# Patient Record
Sex: Female | Born: 1955 | Marital: Married | Smoking: Current every day smoker
Health system: Southern US, Community
[De-identification: ages and names within clinical notes are randomized; demographics above are authoritative.]

## PROBLEM LIST (undated history)

## (undated) DIAGNOSIS — C50919 Malignant neoplasm of unspecified site of unspecified female breast: Secondary | ICD-10-CM

## (undated) DIAGNOSIS — I10 Essential (primary) hypertension: Secondary | ICD-10-CM

## (undated) DIAGNOSIS — E7800 Pure hypercholesterolemia, unspecified: Secondary | ICD-10-CM

## (undated) HISTORY — DX: Essential (primary) hypertension: I10

## (undated) HISTORY — DX: Malignant neoplasm of unspecified site of unspecified female breast: C50.919

## (undated) HISTORY — DX: Pure hypercholesterolemia, unspecified: E78.00

---

## 1989-09-21 HISTORY — PX: CARPAL TUNNEL RELEASE: SHX101

## 1994-09-21 HISTORY — PX: GANGLION CYST EXCISION: SHX1691

## 2005-09-21 HISTORY — PX: ROTATOR CUFF REPAIR: SHX139

## 2005-09-21 HISTORY — PX: BREAST REDUCTION SURGERY: SHX8

## 2005-09-21 HISTORY — PX: BREAST LUMPECTOMY: SHX2

## 2011-11-16 ENCOUNTER — Encounter: Payer: Self-pay | Admitting: Family Medicine

## 2011-11-16 ENCOUNTER — Ambulatory Visit (INDEPENDENT_AMBULATORY_CARE_PROVIDER_SITE_OTHER): Payer: BC Managed Care – PPO | Admitting: Family Medicine

## 2011-11-16 VITALS — BP 151/92 | HR 98 | Ht 62.0 in | Wt 149.0 lb

## 2011-11-16 DIAGNOSIS — F17200 Nicotine dependence, unspecified, uncomplicated: Secondary | ICD-10-CM

## 2011-11-16 DIAGNOSIS — M8580 Other specified disorders of bone density and structure, unspecified site: Secondary | ICD-10-CM

## 2011-11-16 DIAGNOSIS — Z720 Tobacco use: Secondary | ICD-10-CM

## 2011-11-16 DIAGNOSIS — C50919 Malignant neoplasm of unspecified site of unspecified female breast: Secondary | ICD-10-CM

## 2011-11-16 DIAGNOSIS — Z1231 Encounter for screening mammogram for malignant neoplasm of breast: Secondary | ICD-10-CM

## 2011-11-16 DIAGNOSIS — E785 Hyperlipidemia, unspecified: Secondary | ICD-10-CM

## 2011-11-16 DIAGNOSIS — I10 Essential (primary) hypertension: Secondary | ICD-10-CM

## 2011-11-16 DIAGNOSIS — M949 Disorder of cartilage, unspecified: Secondary | ICD-10-CM

## 2011-11-16 MED ORDER — AMLODIPINE BESYLATE 10 MG PO TABS: 10.0000 mg | ORAL_TABLET | Freq: Every day | ORAL | Status: DC

## 2011-11-16 MED ORDER — LISINOPRIL 20 MG PO TABS: 20.0000 mg | ORAL_TABLET | Freq: Two times a day (BID) | ORAL | Status: DC

## 2011-11-16 MED ORDER — BUPROPION HCL ER (XL) 150 MG PO TB24: 150.0000 mg | ORAL_TABLET | Freq: Every day | ORAL | Status: DC

## 2011-11-16 MED ORDER — ALENDRONATE SODIUM 70 MG PO TABS: 70.0000 mg | ORAL_TABLET | ORAL | Status: AC

## 2011-11-16 NOTE — Progress Notes (Signed)
Subjective:    Patient ID: Lorraine Kline, female    DOB: 1956/01/27, 56 y.o.   MRN: [id number]  HPI She is a 56 year old white female who is here to establish care today. She has not seen a physician in over a year. She has a history of breast cancer that was diagnosed in 2007. The last time she had medical care was about a year ago when she went to the emergency department for chest pain. She ruled out for MI at that time.  HTN- She is taking quinapril in the evenings.  Says lisinopril tends to work better for her. She did take this last night. She is in the amlodipine for some time.  She denies any recent chest pain or shortness of breath. She is a smoker. No regular exercise. 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   Osteopenia-she is taken once a month Fosamax in the past and had flulike symptoms. She does much better with the once weekly dosing. She still gets some stomach irritation but says it's tolerable.  Has been having HA and sinus pressure. Coughing at night.  She is a smoker. She feels it is mostly allergies. No fevers or significant nasal drainage or congestion.  Hx of BrCa- it has been well over a year since her last mammogram. She denies any pain or problems or palpable lumps.  Review of Systems  Constitutional: Negative for fever, diaphoresis and unexpected weight change.  HENT: Negative for hearing loss, rhinorrhea and tinnitus.        + allergies  Eyes: Positive for visual disturbance.  Respiratory: Positive for cough. Negative for wheezing.   Cardiovascular: Negative for chest pain and palpitations.  Gastrointestinal: Negative for nausea, vomiting, diarrhea and blood in stool.  Genitourinary: Negative for vaginal bleeding, vaginal discharge and difficulty urinating.  Musculoskeletal: Negative for myalgias and arthralgias.  Skin: Negative for rash.  Neurological: Negative for headaches.  Hematological: Negative for adenopathy. Does not  bruise/bleed easily.  Psychiatric/Behavioral: Negative for sleep disturbance and dysphoric mood. The patient is not nervous/anxious.        BP 151/92  Pulse 98  Ht 5\' 2"  (1.575 m)  Wt 149 lb (67.586 kg)  BMI 27.25 kg/m2    Allergies  Allergen Reactions  . Tamoxifen     Past Medical History  Diagnosis Date  . HTN (hypertension)   . High cholesterol   . Breast cancer     Past Surgical History  Procedure Date  . Carpal tunnel release 1991    Right hand  . Rotator cuff repair 2007    Left shoulder  . Ganglion cyst excision 1996    Left hand  . Breast lumpectomy 2007    Right breast  . Breast reduction surgery 2007    Left breast    History   Social History  . Marital Status: Married    Spouse Name: Shary Key    Number of Children: 3  . Years of Education: Assoc degr   Occupational History  . Not on file.   Social History Main Topics  . Smoking status: Current Everyday Smoker -- 1.0 packs/day for 30 years    Types: Cigarettes  . Smokeless tobacco: Not on file  . Alcohol Use: No  . Drug Use: No  . Sexually Active: Yes -- Female partner(s)   Other Topics Concern  . Not on file   Social History Narrative   No regular exercise, she says secondary to joint pain. 2  caffeine drinks per day.    Family History  Problem Relation Age of Onset  . Heart disease Mother 57  . Lung cancer Father   . Breast cancer      An  . Heart disease Father   . Diabetes Mother   . Diabetes Father   . Hypertension Father   . Hyperlipidemia Mother   . Hyperlipidemia Father     @[email or web address]    Objective:   Physical Exam  Constitutional: She is oriented to person, place, and time. She appears well-developed and well-nourished.  HENT:  Head: Normocephalic and atraumatic.  Cardiovascular: Normal rate, regular rhythm and normal heart sounds.   Pulmonary/Chest: Effort normal and breath sounds normal.  Musculoskeletal: She exhibits no edema.  Neurological: She is alert  and oriented to person, place, and time.  Skin: Skin is warm and dry.  Psychiatric: She has a normal mood and affect. Her behavior is normal.          Assessment & Plan:  HTN - Change to lisinopril. Add back the amlodipine. F/U in 1 months to recheck blood pressure. I reviewed the importance of eating a low salt diet. Encouraged regular exercise..   Tob abuse- we discussed different options. She does not do well the Chantix. She does so she has some depressive symptoms I think will be to actually be a great choice for her.  Depression - PHQ- 9 score of 17 (moderately severe). WE discussed options. She is also interested in smoking cessation and Wellbutrin will be a great choice. We will have to see how well the section controls her depression. Follow up in one month. She may need an SSRI too.   Breast cancer in 2007. Over due for mammo. Didn't have one last year. Will schedule.   Osteopenia-out of her back on her bisphosphonate. She believes she is not due for a repeat DEXA for about another 6 months.  Sinus pressure-most likely related to allergic rhinitis-recommend a trial of Claritin or Allegra or Zyrtec without the decongestant for at least a week to see if this improves her symptoms. If not may consider adding a nasal steroid or possibly even treating with an antibiotic to see if it could be sinusitis. She has no fever or significant nasal congestion.

## 2011-11-16 NOTE — Patient Instructions (Addendum)
We will call you with your lab results. If you don't here from [place] in about a week then please give [place] a call at [phone]. 1.5 Gram Low Sodium Diet A 1.5 gram sodium diet restricts the amount of sodium in the diet to no more than 1.5 g or 1500 mg daily. The American Heart Association recommends Americans over the age of 78 to consume no more than 1500 mg of sodium each day to reduce the risk of developing high blood pressure. Research also shows that limiting sodium may reduce heart attack and stroke risk. Many foods contain sodium for flavor and sometimes as a preservative. When the amount of sodium in a diet needs to be low, it is important to know what to look for when choosing foods and drinks. The following includes some information and guidelines to help make it easier for you to adapt to a low sodium diet. QUICK TIPS  Do not add salt to food.     Avoid convenience items and fast food.     Choose unsalted snack foods.     Buy lower sodium products, often labeled as "lower sodium" or "no salt added."     Check food labels to learn how much sodium is in 1 serving.     When eating at a restaurant, ask that your food be prepared with less salt or none, if possible.  READING FOOD LABELS FOR SODIUM INFORMATION The nutrition facts label is a good place to find how much sodium is in foods. Look for products with no more than 400 mg of sodium per serving. Remember that 1.5 g = 1500 mg. The food label may also list foods as:  Sodium-free: Less than 5 mg in a serving.     Very low sodium: 35 mg or less in a serving.     Low-sodium: 140 mg or less in a serving.     Light in sodium: 50% less sodium in a serving. For example, if a food that usually has 300 mg of sodium is changed to become light in sodium, it will have 150 mg of sodium.     Reduced sodium: 25% less sodium in a serving. For example, if a food that usually has 400 mg of sodium is changed to reduced sodium, it will have 300 mg of  sodium.  CHOOSING FOODS Grains  Avoid: Salted crackers and snack items. Some cereals, including instant hot cereals. Bread stuffing and biscuit mixes. Seasoned rice or pasta mixes.     Choose: Unsalted snack items. Low-sodium cereals, oats, puffed wheat and rice, shredded wheat. English muffins and bread. Pasta.  Meats  Avoid: Salted, canned, smoked, spiced, pickled meats, including fish and poultry. Bacon, ham, sausage, cold cuts, hot dogs, anchovies.     Choose: Low-sodium canned tuna and salmon. Fresh or frozen meat, poultry, and fish.  Dairy  Avoid: Processed cheese and spreads. Cottage cheese. Buttermilk and condensed milk. Regular cheese.     Choose: Milk. Low-sodium cottage cheese. Yogurt. Sour cream. Low-sodium cheese.  Fruits and Vegetables  Avoid: Regular canned vegetables. Regular canned tomato sauce and paste. Frozen vegetables in sauces. Olives. Rosita Fire. Relishes. Sauerkraut.     Choose: Low-sodium canned vegetables. Low-sodium tomato sauce and paste. Frozen or fresh vegetables. Fresh and frozen fruit.  Condiments  Avoid: Canned and packaged gravies. Worcestershire sauce. Tartar sauce. Barbecue sauce. Soy sauce. Steak sauce. Ketchup. Onion, garlic, and table salt. Meat flavorings and tenderizers.     Choose: Fresh and dried herbs and  spices. Low-sodium varieties of mustard and ketchup. Lemon juice. Tabasco sauce. Horseradish.  SAMPLE 1.5 GRAM SODIUM MEAL PLAN Breakfast / Sodium (mg)  1 cup low-fat milk / 143 mg     1 whole-wheat English muffin / 240 mg     1 tbs heart-healthy margarine / 153 mg     1 hard-boiled egg / 139 mg     1 small orange / 0 mg  Lunch / Sodium (mg)  1 cup raw carrots / 76 mg     2 tbs no salt added peanut butter / 5 mg     2 slices whole-wheat bread / 270 mg     1 tbs jelly / 6 mg      cup red grapes / 2 mg  Dinner / Sodium (mg)  1 cup whole-wheat pasta / 2 mg     1 cup low-sodium tomato sauce / 73 mg     3 oz lean ground  beef / 57 mg     1 small side salad (1 cup raw spinach leaves,  cup cucumber,  cup yellow bell pepper) with 1 tsp olive oil and 1 tsp red wine vinegar / 25 mg  Snack / Sodium (mg)  1 container low-fat vanilla yogurt / 107 mg     3 graham cracker squares / 127 mg  Nutrient Analysis  Calories: 1745     Protein: 75 g     Carbohydrate: 237 g     Fat: 57 g     Sodium: 1425 mg  Document Released: 09/07/2005 Document Revised: 05/20/2011 Document Reviewed: 12/09/2009 [facility] Patient Information 2012 [place], [place].

## 2011-11-26 ENCOUNTER — Telehealth: Payer: Self-pay | Admitting: *Deleted

## 2011-11-26 ENCOUNTER — Encounter: Payer: Self-pay | Admitting: Family Medicine

## 2011-11-26 DIAGNOSIS — Z1231 Encounter for screening mammogram for malignant neoplasm of breast: Secondary | ICD-10-CM

## 2011-11-26 DIAGNOSIS — J449 Chronic obstructive pulmonary disease, unspecified: Secondary | ICD-10-CM | POA: Insufficient documentation

## 2011-11-26 NOTE — Telephone Encounter (Signed)
Order placed

## 2011-11-26 NOTE — Telephone Encounter (Signed)
States that they called pt to schedule mammogram and that pt needs to have a diagnostic mammogram due to history of breast cancer. Pt stated to them that she wants [place] to refer her to [place] b/c she doesn't want to go to [place] to have it done.

## 2011-12-15 ENCOUNTER — Ambulatory Visit: Payer: BC Managed Care – PPO | Admitting: Family Medicine

## 2011-12-23 ENCOUNTER — Telehealth: Payer: Self-pay | Admitting: *Deleted

## 2011-12-23 DIAGNOSIS — Z1231 Encounter for screening mammogram for malignant neoplasm of breast: Secondary | ICD-10-CM

## 2011-12-23 NOTE — Telephone Encounter (Signed)
Pt called and states she needs an order resent for a screening mammo and not a diagnostic to [facility]

## 2011-12-23 NOTE — Telephone Encounter (Signed)
Addended by: Nani Gasser D on: 12/23/2011 01:27 PM   Modules accepted: Orders

## 2011-12-23 NOTE — Telephone Encounter (Signed)
New order placed and old one removed.

## 2011-12-24 ENCOUNTER — Telehealth: Payer: Self-pay | Admitting: *Deleted

## 2012-03-25 ENCOUNTER — Emergency Department (INDEPENDENT_AMBULATORY_CARE_PROVIDER_SITE_OTHER): Payer: BC Managed Care – PPO

## 2012-03-25 ENCOUNTER — Emergency Department (INDEPENDENT_AMBULATORY_CARE_PROVIDER_SITE_OTHER)
Admission: EM | Admit: 2012-03-25 | Discharge: 2012-03-25 | Disposition: A | Discharge status: Home / Self Care | Payer: BC Managed Care – PPO | Source: Home / Self Care | Attending: Family Medicine | Admitting: Family Medicine

## 2012-03-25 DIAGNOSIS — J189 Pneumonia, unspecified organism: Secondary | ICD-10-CM

## 2012-03-25 LAB — POCT CBC W AUTO DIFF (K'VILLE URGENT CARE)

## 2012-03-25 MED ORDER — ALBUTEROL SULFATE HFA 108 (90 BASE) MCG/ACT IN AERS: 2.0000 | INHALATION_SPRAY | RESPIRATORY_TRACT | Status: DC | PRN

## 2012-03-25 MED ORDER — HYDROCODONE-ACETAMINOPHEN 5-500 MG PO TABS: 1.0000 | ORAL_TABLET | Freq: Every evening | ORAL | Status: AC | PRN

## 2012-03-25 MED ORDER — BENZONATATE 200 MG PO CAPS: 200.0000 mg | ORAL_CAPSULE | Freq: Every day | ORAL | Status: AC

## 2012-03-25 MED ORDER — LEVOFLOXACIN 500 MG PO TABS: 500.0000 mg | ORAL_TABLET | Freq: Every day | ORAL | Status: AC

## 2012-03-25 NOTE — ED Provider Notes (Signed)
History     CSN: [id number]  Arrival date & time 03/25/12  1626   First MD Initiated Contact with Patient 03/25/12 1715      Chief Complaint  Patient presents with  . Cough    couple days  . Wheezing    couple days      HPI Comments: Patient complains of 3 day history of gradually progressive URI symptoms beginning with a mild sore throat (now improved), nasal congestion, and cough.  Complains of fatigue and myalgias.  Cough is now worse at night and generally non-productive during the day.  There has been no pleuritic pain, but she has shortness of breath with activity and wheezing.  She has had a fever to 102.2 today.  She has used an albuterol inhaler in the past and feels that one would be helpful again.  She has had pneumonia in the past.  She continues to smoke.   The history is provided by the patient and the spouse.    Past Medical History  Diagnosis Date  . HTN (hypertension)   . High cholesterol   . Breast cancer     Past Surgical History  Procedure Date  . Carpal tunnel release 1991    Right hand  . Rotator cuff repair 2007    Left shoulder  . Ganglion cyst excision 1996    Left hand  . Breast lumpectomy 2007    Right breast  . Breast reduction surgery 2007    Left breast    Family History  Problem Relation Age of Onset  . Heart disease Mother 57  . Diabetes Mother   . Hyperlipidemia Mother   . Heart failure Mother   . Lung cancer Father   . Heart disease Father   . Diabetes Father   . Hypertension Father   . Hyperlipidemia Father   . Cancer Father     Lung  . Breast cancer      An  . Cancer Other     Lung    History  Substance Use Topics  . Smoking status: Current Everyday Smoker -- 1.0 packs/day for 30 years    Types: Cigarettes  . Smokeless tobacco: Never Used  . Alcohol Use: No    OB History    Grav Para Term Preterm Abortions TAB SAB Ect Mult Living                  Review of Systems + sore throat + cough No pleuritic  pain + wheezing + nasal congestion + post-nasal drainage No sinus pain/pressure No itchy/red eyes No earache No hemoptysis + SOB with activity + fever, + chills + nausea initially + vomiting, initially, resolved No abdominal pain No diarrhea No urinary symptoms No skin rashes + fatigue + myalgias + headache   Allergies  Penicillins and Tamoxifen  Home Medications   Current Outpatient Rx  Name Route Sig Dispense Refill  . ACETAMINOPHEN 500 MG PO TABS Oral Take 500 mg by mouth every 6 (six) hours as needed.    . ALENDRONATE SODIUM 70 MG PO TABS Oral Take 1 tablet (70 mg total) by mouth every 7 (seven) days. Take with a full glass of water on an empty stomach. 4 tablet 11  . AMLODIPINE BESYLATE 10 MG PO TABS Oral Take 1 tablet (10 mg total) by mouth daily. 30 tablet 6  . BUPROPION HCL ER (XL) 150 MG PO TB24 Oral Take 1 tablet (150 mg total) by mouth daily. 30 tablet 3  .  CALCIUM CARBONATE 600 MG PO TABS Oral Take 600 mg by mouth 2 (two) times daily with a meal.    . IBUPROFEN 200 MG PO TABS Oral Take 200 mg by mouth every 6 (six) hours as needed.    Marland Kitchen LISINOPRIL 20 MG PO TABS Oral Take 1 tablet (20 mg total) by mouth 2 (two) times daily. 60 tablet 3  . ALBUTEROL SULFATE HFA 108 (90 BASE) MCG/ACT IN AERS Inhalation Inhale 2 puffs into the lungs every 4 (four) hours as needed for wheezing. 1 Inhaler 0  . BENZONATATE 200 MG PO CAPS Oral Take 1 capsule (200 mg total) by mouth at bedtime. Take as needed for cough 12 capsule 0  . LEVOFLOXACIN 500 MG PO TABS Oral Take 1 tablet (500 mg total) by mouth daily. 10 tablet 0    BP 126/84  Pulse 100  Temp 99.8 F (37.7 C) (Oral)  Resp 18  Ht 5\' 2"  (1.575 m)  SpO2 96%  Physical Exam Nursing notes and Vital Signs reviewed. Appearance:  Patient appears older than stated age, and in no acute distress.  She is alert and oriented  Eyes:  Pupils are equal, round, and reactive to light and accomodation.  Extraocular movement is intact.   Conjunctivae are not inflamed  Ears:  Canals normal.  Tympanic membranes normal.  Nose:  Mildly congested turbinates.  No sinus tenderness.   Pharynx:  Normal; moist mucous membranes  Neck:  Supple.  No adenopathy Lungs:   Bilateral wheezes/rhonchi posteriorly; no rales.  Breath sounds are equal.  No respiratory distress Heart:  Regular rate and rhythm without murmurs, rubs, or gallops.  Abdomen:  Nontender without masses or hepatosplenomegaly.  Bowel sounds are present.  No CVA or flank tenderness.  Extremities:  No edema.  No calf tenderness Skin:  No rash present.   ED Course  Procedures    Labs Reviewed  POCT CBC W AUTO DIFF ([facility]) - Abnormal; Notable for the following:    WBC 12.3; LY 19.2; MO 7.4; GR 73.4; Hgb 13.9; Platelets 300    Dg Chest 2 View  03/25/2012  *RADIOLOGY REPORT*  Clinical Data: Cough.  Cigarette smoker.  CHEST - 2 VIEW  Comparison: None.  Findings: Patchy density is present at the right lung base, localizing to the low right lower lobe on the lateral view, anterior to the thoracic spine.  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 Cardiopericardial silhouette appears within normal limits. Mediastinal contours are normal.  No effusion. Follow-up to ensure radiographic clearing recommended.  Clearing is usually observed at 8 weeks.  IMPRESSION: Right lower lobe pneumonia.  Original Report Authenticated By: Andreas Newport, M.D.     1. Right lower lobe pneumonia.  Note leukocytosis 12.3       MDM  Begin Levaquin for 10 days.  Rx written for albuterol MDI.  Prescription written for Benzonatate [facility]) to take at bedtime for night-time cough.  Increase fluid intake.  Rest.  Begin Mucinex (guaifenesin) for congestion. If symptoms become significantly worse during the night or over the weekend, proceed to the local emergency room.  Followup with family doctor in 3 days Followup with family doctor after treatment  for repeat chest X-ray.        Lattie Haw, MD 03/27/12 [place]

## 2012-03-25 NOTE — ED Notes (Signed)
Lorraine Kline complains of wheezing and dry cough for a couple days. She also complains of muscle pain in her ribs due to coughing. She has had fever, chills and sweats. The day before the onset of her symptoms she did have some stomach upset and vomiting, no since.

## 2012-03-28 ENCOUNTER — Ambulatory Visit: Payer: BC Managed Care – PPO | Admitting: Physician Assistant

## 2012-03-30 ENCOUNTER — Encounter: Payer: Self-pay | Admitting: Physician Assistant

## 2012-03-30 ENCOUNTER — Ambulatory Visit (INDEPENDENT_AMBULATORY_CARE_PROVIDER_SITE_OTHER): Payer: BC Managed Care – PPO | Admitting: Physician Assistant

## 2012-03-30 VITALS — BP 101/76 | HR 86 | Temp 98.2°F | Wt 146.0 lb

## 2012-03-30 DIAGNOSIS — Z720 Tobacco use: Secondary | ICD-10-CM

## 2012-03-30 DIAGNOSIS — F329 Major depressive disorder, single episode, unspecified: Secondary | ICD-10-CM

## 2012-03-30 DIAGNOSIS — R031 Nonspecific low blood-pressure reading: Secondary | ICD-10-CM

## 2012-03-30 DIAGNOSIS — J189 Pneumonia, unspecified organism: Secondary | ICD-10-CM

## 2012-03-30 DIAGNOSIS — F17200 Nicotine dependence, unspecified, uncomplicated: Secondary | ICD-10-CM

## 2012-03-30 MED ORDER — BUPROPION HCL ER (XL) 300 MG PO TB24: 300.0000 mg | ORAL_TABLET | Freq: Every day | ORAL | Status: DC

## 2012-03-30 MED ORDER — AMBULATORY NON FORMULARY MEDICATION: Status: DC

## 2012-03-30 NOTE — Progress Notes (Signed)
  Subjective:    Patient ID: Lorraine Kline, female    DOB: 1955-11-10, 56 y.o.   MRN: [id number]  HPI Patient presents to the clinic to follow up on pneumonia. She was seen by urgent care 5 days ago and by chest xray diagnosed with right lower lobe pneumonia. She was given levaquin. She was not able to afford proair. She smokes and has COPD. She has decreased in amt she smokes.She is not currently on daily therapy for COPD. She is feeling much better today. She has started to get her appetite back. She is still very SOB and hasn't had a rescue inhaler. She has not ran a fever in the last 3 days.   BP been running low for weeks. She has been feeling some lightheadedness and when she checks her blood pressure sometimes it is in the 90's over 50's. When that low she does not take her lisinopril. Denies any CP or palpitations.   Pt has been more depressed lately. She just feels down. Welbutrin has helped but wants to know if she can have a higher dose.    Review of Systems     Objective:   Physical Exam  Constitutional: She is oriented to person, place, and time. She appears well-developed and well-nourished.  HENT:  Head: Normocephalic and atraumatic.  Cardiovascular: Normal rate, regular rhythm and normal heart sounds.   Pulmonary/Chest: Effort normal. She has no wheezes.       Able to hear breath movement over all quadrants but slighty less in the right lower quadrant. NO wheezing or rales. Coarse breath sounds heard in both lungs.   Neurological: She is alert and oriented to person, place, and time.  Skin: Skin is warm and dry.  Psychiatric: She has a normal mood and affect. Her behavior is normal.          Assessment & Plan:  Pneumonia- Continue on Levaquin and finish 10 day cycle. Gave order form to get repeat chest x-ray after finishing Levaquin. If pneumonia not resolved will increase duration of abx. Gave proair sample since not able to afford. Encouraged to quit smoking. REassured  that SOB would likely take up to 60 days to get completley over and then COPD might also leave pt SOB. If continues then needs follow up appt to discuss daily inhaler for COPD.   Low blood pressure- hx of HTN.Patient has been taking bp and running low. Told her to stop taking lisinopril since that is the medication she usually doesn't take if bp running low. Continue to recheck bp throughout the week. Follow up with nurse visit in 2 weeks. If still low may cut Norvasc in half.   Tobacco abuse- Pt has cut back on smoking but has not completely eliminated the smoking. Encouraged patient to quit. Discussed how much better she would feel if she stopped smoking and how her lungs would improve.   Depression- increased Wellbutrin to 300mg  daily today. Hopefully this will help with mood and potentially help with smoking cessation. Follow up in 4-6 weeks.

## 2012-03-30 NOTE — Patient Instructions (Addendum)
Get chest x-ray after finish levaquin. Mucinex could help loosen mucus. Hot vapor steam. Use proair as needed. Increased Wellbutrin. Follow up in 4-6 weeks. Call if worsening or not improving.

## 2012-04-07 ENCOUNTER — Ambulatory Visit (INDEPENDENT_AMBULATORY_CARE_PROVIDER_SITE_OTHER): Payer: BC Managed Care – PPO

## 2012-04-07 ENCOUNTER — Telehealth: Payer: Self-pay | Admitting: *Deleted

## 2012-04-07 DIAGNOSIS — J189 Pneumonia, unspecified organism: Secondary | ICD-10-CM

## 2012-05-16 ENCOUNTER — Ambulatory Visit (INDEPENDENT_AMBULATORY_CARE_PROVIDER_SITE_OTHER): Payer: BC Managed Care – PPO | Admitting: Family Medicine

## 2012-05-16 ENCOUNTER — Encounter: Payer: Self-pay | Admitting: Family Medicine

## 2012-05-16 VITALS — BP 145/91 | HR 77 | Wt 148.0 lb

## 2012-05-16 DIAGNOSIS — K469 Unspecified abdominal hernia without obstruction or gangrene: Secondary | ICD-10-CM

## 2012-05-16 DIAGNOSIS — K5900 Constipation, unspecified: Secondary | ICD-10-CM

## 2012-05-16 NOTE — Progress Notes (Signed)
  Subjective:    Patient ID: Lorraine Kline, female    DOB: May 25, 1956, 56 y.o.   MRN: [id number]  HPI Has been constipated since started her new job 1.5 weeks ago. Only gets a few breaks at work so hard to drink plenty of fluids. Pain was moving around.  Has a hernia in the epigastric area and started feeling pain there so was worried. Tried dulcolax and helped hte first time. She tried a second time but it did not trigger a bowel movement. Then tried "magnesiu" and that really helped.  No fever.  She has not had any pain discomfort today.   Review of Systems     Objective:   Physical Exam  Constitutional: She is oriented to person, place, and time. She appears well-developed and well-nourished.  HENT:  Head: Normocephalic and atraumatic.  Cardiovascular: Normal rate, regular rhythm and normal heart sounds.   Pulmonary/Chest: Effort normal and breath sounds normal.  Abdominal: Soft. Bowel sounds are normal. She exhibits no distension and no mass. There is tenderness. There is no rebound and no guarding.       I am able to palpate a hernia.  No incarcerated bowels.  Tenderness in the epigastrum.   Neurological: She is alert and oriented to person, place, and time.  Skin: Skin is warm and dry.  Psychiatric: She has a normal mood and affect. Her behavior is normal.          Assessment & Plan:  Consitpation- we discussed different treatment options. She will start with either a daily Dulcolax for stool softener were start a fiber supplement such as Benefiber. The Benefiber she can easily under her water bottle and drink during the day and it has not taken as it fits. Once her bowels are moving more normally if she continues to have any pain discomfort then please let me know. She is feeling better since she had a bowel movement over the weekend. Continue trihydrate is much as possible though I know she is limited by time constraints at work. I do encourage her to avoid laxatives unless for  occasional use. Explained that daily use can cause problems to the colon. She can also use MiraLax if needed.  Hernia- hernia does not appear to be swollen or irritated or incarcerated today. discussed warning signs for incarcated hernia. I did give her reassurance.

## 2012-05-16 NOTE — Patient Instructions (Addendum)
Can use Benefiber (scoop) into your water bottle.   You can also start a daily stool softener. Can use up to 3 times a day if needed to keep you bowels moving.   Let me know if continue to have pain in her hernia area.

## 2012-08-08 ENCOUNTER — Ambulatory Visit (INDEPENDENT_AMBULATORY_CARE_PROVIDER_SITE_OTHER): Payer: BC Managed Care – PPO | Admitting: Family Medicine

## 2012-08-08 ENCOUNTER — Encounter: Payer: Self-pay | Admitting: Family Medicine

## 2012-08-08 VITALS — BP 151/94 | HR 92 | Ht 62.0 in | Wt 148.0 lb

## 2012-08-08 DIAGNOSIS — I10 Essential (primary) hypertension: Secondary | ICD-10-CM

## 2012-08-08 DIAGNOSIS — M899 Disorder of bone, unspecified: Secondary | ICD-10-CM

## 2012-08-08 DIAGNOSIS — Z853 Personal history of malignant neoplasm of breast: Secondary | ICD-10-CM

## 2012-08-08 DIAGNOSIS — M8580 Other specified disorders of bone density and structure, unspecified site: Secondary | ICD-10-CM

## 2012-08-08 DIAGNOSIS — Z23 Encounter for immunization: Secondary | ICD-10-CM

## 2012-08-08 MED ORDER — BUPROPION HCL ER (XL) 300 MG PO TB24: 300.0000 mg | ORAL_TABLET | Freq: Every day | ORAL | Status: DC

## 2012-08-08 MED ORDER — BUPROPION HCL ER (XL) 150 MG PO TB24: 150.0000 mg | ORAL_TABLET | Freq: Every day | ORAL | Status: DC

## 2012-08-08 MED ORDER — AMLODIPINE BESYLATE 10 MG PO TABS: 10.0000 mg | ORAL_TABLET | Freq: Every day | ORAL | Status: DC

## 2012-08-08 NOTE — Progress Notes (Signed)
  Subjective:    Patient ID: Lorraine Kline, female    DOB: 1955/12/28, 56 y.o.   MRN: [id number]  Hypertension This is a chronic problem. The current episode started more than 1 year ago. The problem is uncontrolled. Pertinent negatives include no chest pain or shortness of breath. There are no associated agents to hypertension. There are no compliance problems.       Review of Systems  Respiratory: Negative for shortness of breath.   Cardiovascular: Negative for chest pain.       Objective:   Physical Exam  Constitutional: She is oriented to person, place, and time. She appears well-developed and well-nourished.  HENT:  Head: Normocephalic and atraumatic.  Cardiovascular: Normal rate, regular rhythm and normal heart sounds.   Pulmonary/Chest: Effort normal and breath sounds normal.  Neurological: She is alert and oriented to person, place, and time.  Skin: Skin is warm and dry.  Psychiatric: She has a normal mood and affect. Her behavior is normal.          Assessment & Plan:  HTN - Uncontrolled. Needs to restart  BP meds. Can break in half if drops too low. Refilled meds  Hx of BrCA - Was unable to get her mammogram. Says she kept going back and forth to [employer]. They won't pay for screening and not pay for diagnostic but she does have a history of breast cancer diagnosed 6 years ago.  Osteopenia-reminded her to try to take her alendronate. She says she often forgets.  Pneumonia and flu vaccines given today.

## 2012-11-13 ENCOUNTER — Other Ambulatory Visit: Payer: Self-pay | Admitting: Family Medicine

## 2013-01-11 ENCOUNTER — Other Ambulatory Visit: Payer: Self-pay | Admitting: Family Medicine

## 2013-03-16 ENCOUNTER — Other Ambulatory Visit: Payer: Self-pay | Admitting: Family Medicine

## 2013-03-29 ENCOUNTER — Ambulatory Visit (INDEPENDENT_AMBULATORY_CARE_PROVIDER_SITE_OTHER): Payer: BC Managed Care – PPO | Admitting: Family Medicine

## 2013-03-29 ENCOUNTER — Encounter: Payer: Self-pay | Admitting: Family Medicine

## 2013-03-29 VITALS — BP 148/90 | HR 89 | Ht 62.0 in | Wt 142.0 lb

## 2013-03-29 DIAGNOSIS — I10 Essential (primary) hypertension: Secondary | ICD-10-CM

## 2013-03-29 DIAGNOSIS — R319 Hematuria, unspecified: Secondary | ICD-10-CM

## 2013-03-29 DIAGNOSIS — R238 Other skin changes: Secondary | ICD-10-CM

## 2013-03-29 DIAGNOSIS — J449 Chronic obstructive pulmonary disease, unspecified: Secondary | ICD-10-CM

## 2013-03-29 DIAGNOSIS — E785 Hyperlipidemia, unspecified: Secondary | ICD-10-CM

## 2013-03-29 DIAGNOSIS — N23 Unspecified renal colic: Secondary | ICD-10-CM

## 2013-03-29 LAB — CBC WITH DIFFERENTIAL/PLATELET
Basophils Absolute: 0.1 10*3/uL (ref 0.0–0.1)
Basophils Relative: 1 % (ref 0–1)
Eosinophils Absolute: 0.2 10*3/uL (ref 0.0–0.7)
Eosinophils Relative: 2 % (ref 0–5)
HCT: 40.7 % (ref 36.0–46.0)
MCH: 31.6 pg (ref 26.0–34.0)
MCHC: 33.4 g/dL (ref 30.0–36.0)
MCV: 94.7 fL (ref 78.0–100.0)
Monocytes Absolute: 1.1 10*3/uL — ABNORMAL HIGH (ref 0.1–1.0)
Monocytes Relative: 11 % (ref 3–12)
RDW: 12.9 % (ref 11.5–15.5)

## 2013-03-29 LAB — POCT URINALYSIS DIPSTICK
Bilirubin, UA: NEGATIVE
Glucose, UA: NEGATIVE
Ketones, UA: NEGATIVE
pH, UA: 8

## 2013-03-29 LAB — LIPID PANEL
Cholesterol: 234 mg/dL — ABNORMAL HIGH (ref 0–200)
HDL: 41 mg/dL (ref >39)
LDL Cholesterol: 144 mg/dL — ABNORMAL HIGH (ref 0–99)
Triglycerides: 247 mg/dL — ABNORMAL HIGH (ref <150)

## 2013-03-29 LAB — COMPLETE METABOLIC PANEL WITH GFR
ALT: 9 U/L (ref 0–35)
AST: 20 U/L (ref 0–37)
Alkaline Phosphatase: 74 U/L (ref 39–117)
Creat: 0.79 mg/dL (ref 0.50–1.10)
Total Bilirubin: 0.4 mg/dL (ref 0.3–1.2)

## 2013-03-29 MED ORDER — BUPROPION HCL ER (XL) 300 MG PO TB24: 300.0000 mg | ORAL_TABLET | Freq: Every day | ORAL | Status: DC

## 2013-03-29 MED ORDER — FLUOXETINE HCL 10 MG PO CAPS: ORAL_CAPSULE | ORAL | Status: DC

## 2013-03-29 MED ORDER — LISINOPRIL 20 MG PO TABS: ORAL_TABLET | ORAL | Status: DC

## 2013-03-29 MED ORDER — AMLODIPINE BESYLATE 10 MG PO TABS: 10.0000 mg | ORAL_TABLET | Freq: Every day | ORAL | Status: DC

## 2013-03-29 MED ORDER — BUPROPION HCL ER (XL) 150 MG PO TB24: 150.0000 mg | ORAL_TABLET | Freq: Every day | ORAL | Status: DC

## 2013-03-29 NOTE — Progress Notes (Signed)
Subjective:    Patient ID: Lorraine Kline, female    DOB: 04/22/1956, 57 y.o.   MRN: [id number]  HPI HTN -  Pt denies chest pain, SOB, dizziness, or heart palpitations.  Taking meds as directed w/o problems.  Denies medication side effects. Out of meds for a week.  Has an episode of reflux that she says one of the back of her throat and her nose after waking up from a nap. It has not happened since then she has not had any recurrence of symptoms. She says she avoids acidic foods in general and did not eat anything out of the norm for her before this happened.    COPD - No recent flares of SOB.    Hyperlipidemia-she did not go for bloodwork last September when she was given a lab slip.  Has had foamy urine and some kidney pain x 1 months.  Has been drinking cranberry juice. No dysuria, fever or hematuria or urgency.    Mood D/O -  She says the Wellbutrin is helpful and does take the edge off. She's taking 450 mg currently but says she still feels ago. She said she started yelling at her daughter-in-law about a week ago and says that that is not acceptable. She is interested in trying something else. She did take sertraline years ago after the death of her husband and says that it actually made her feel worse. She's not tried any other medications. Review of Systems She has noticed some easy bruising recently.  BP 148/90  Pulse 89  Ht 5\' 2"  (1.575 m)  Wt 142 lb (64.411 kg)  BMI 25.97 kg/m2    Allergies  Allergen Reactions  . Penicillins   . Sertraline Other (See Comments)    Worsened her depression  . Tamoxifen     Past Medical History  Diagnosis Date  . HTN (hypertension)   . High cholesterol   . Breast cancer     Past Surgical History  Procedure Laterality Date  . Carpal tunnel release  1991    Right hand  . Rotator cuff repair  2007    Left shoulder  . Ganglion cyst excision  1996    Left hand  . Breast lumpectomy  2007    Right breast  . Breast reduction surgery  2007     Left breast    History   Social History  . Marital Status: Married    Spouse Name: Shary Key    Number of Children: 3  . Years of Education: Assoc degr   Occupational History  . Not on file.   Social History Main Topics  . Smoking status: Current Every Day Smoker -- 1.00 packs/day for 30 years    Types: Cigarettes  . Smokeless tobacco: Never Used  . Alcohol Use: No  . Drug Use: No  . Sexually Active: Yes -- Female partner(s)   Other Topics Concern  . Not on file   Social History Narrative   No regular exercise, she says secondary to joint pain. 2 caffeine drinks per day.    Family History  Problem Relation Age of Onset  . Heart disease Mother 16  . Diabetes Mother   . Hyperlipidemia Mother   . Heart failure Mother   . Lung cancer Father   . Heart disease Father   . Diabetes Father   . Hypertension Father   . Hyperlipidemia Father   . Cancer Father     Lung  . Breast cancer  An  . Cancer Other     Lung    Outpatient Encounter Prescriptions as of 03/29/2013  Medication Sig Dispense Refill  . acetaminophen (TYLENOL) 500 MG tablet Take 500 mg by mouth every 6 (six) hours as needed.      Marland Kitchen amLODipine (NORVASC) 10 MG tablet Take 1 tablet (10 mg total) by mouth daily.  90 tablet  1  . buPROPion (WELLBUTRIN XL) 150 MG 24 hr tablet Take 1 tablet (150 mg total) by mouth daily.  90 tablet  1  . lisinopril (PRINIVIL,ZESTRIL) 20 MG tablet TAKE ONE TABLET BY MOUTH TWICE DAILY  60 tablet  2  . [DISCONTINUED] amLODipine (NORVASC) 10 MG tablet Take 1 tablet (10 mg total) by mouth daily.  90 tablet  1  . [DISCONTINUED] buPROPion (WELLBUTRIN XL) 150 MG 24 hr tablet Take 1 tablet (150 mg total) by mouth daily.  90 tablet  1  . [DISCONTINUED] buPROPion (WELLBUTRIN XL) 300 MG 24 hr tablet Take 1 tablet (300 mg total) by mouth daily.  90 tablet  1  . [DISCONTINUED] buPROPion (WELLBUTRIN XL) 300 MG 24 hr tablet Take 1 tablet (300 mg total) by mouth daily.  90 tablet  1  .  [DISCONTINUED] lisinopril (PRINIVIL,ZESTRIL) 20 MG tablet TAKE ONE TABLET BY MOUTH TWICE DAILY  60 tablet  0  . albuterol (PROVENTIL HFA;VENTOLIN HFA) 108 (90 BASE) MCG/ACT inhaler Inhale 2 puffs into the lungs every 4 (four) hours as needed for wheezing.  1 Inhaler  0  . FLUoxetine (PROZAC) 10 MG capsule One a day x 1 week, then increase to 2 a day.  60 capsule  0  . [DISCONTINUED] calcium carbonate (OS-CAL) 600 MG TABS Take 600 mg by mouth 2 (two) times daily with a meal.      . [DISCONTINUED] ibuprofen (ADVIL,MOTRIN) 200 MG tablet Take 200 mg by mouth every 6 (six) hours as needed.      . [DISCONTINUED] naproxen sodium (ANAPROX) 220 MG tablet Take 220 mg by mouth daily as needed.       No facility-administered encounter medications on file as of 03/29/2013.          Objective:   Physical Exam  Constitutional: She is oriented to person, place, and time. She appears well-developed and well-nourished.  HENT:  Head: Normocephalic and atraumatic.  Cardiovascular: Normal rate, regular rhythm and normal heart sounds.   Pulmonary/Chest: Effort normal and breath sounds normal.  Musculoskeletal:  No CVA tenderness.  Neurological: She is alert and oriented to person, place, and time.  Skin: Skin is warm and dry.  Psychiatric: She has a normal mood and affect. Her behavior is normal.    Bruising on posterior forearms.      Assessment & Plan:  Hypertension-uncontrolled today. Restart BP meds. Due for CMP and lipids. Never went to lab back in sept  Hyperlipidemia-Will check labs today.    COPD- no recent flares. Recommend schedule for spirometry in one month. We have not done spirometry since she has been a patient here. And she thinks the last time she had lung testing was approximately 3-4 years ago.  Mood disorder-we discussed different options. We could certainly try fluoxetine which I think would be very reasonable that it will tolerated it doesn't tend to cause weight gain. She would  like to offer this. I ruled out a taper off of the Wellbutrin for her and then she will stop the fluoxetine. We can see how she's doing when I see her back she'll  have been on it for about a week at that point in time.  UA-positive for blood. We'll send for culture. Will call with results once available.  Tob abuse - encouraged cessation   Easy bruising-will check PT/INR, CBC.

## 2013-03-29 NOTE — Patient Instructions (Addendum)
Schedule spirometry (breathing test) in one month.  Decrease wellbetrin to 300mg  for one week, then decrease to 150 mg for one week. Intake 150 mg every other day for one week, then stop and he can start the new medication, called fluoxetine.

## 2013-03-31 LAB — URINE CULTURE: Organism ID, Bacteria: NO GROWTH

## 2013-04-28 ENCOUNTER — Ambulatory Visit (INDEPENDENT_AMBULATORY_CARE_PROVIDER_SITE_OTHER): Payer: BC Managed Care – PPO | Admitting: Family Medicine

## 2013-04-28 ENCOUNTER — Encounter: Payer: Self-pay | Admitting: Family Medicine

## 2013-04-28 ENCOUNTER — Ambulatory Visit (INDEPENDENT_AMBULATORY_CARE_PROVIDER_SITE_OTHER): Payer: BC Managed Care – PPO

## 2013-04-28 VITALS — BP 161/99 | HR 76 | Wt 145.0 lb

## 2013-04-28 DIAGNOSIS — M546 Pain in thoracic spine: Secondary | ICD-10-CM

## 2013-04-28 DIAGNOSIS — Z1231 Encounter for screening mammogram for malignant neoplasm of breast: Secondary | ICD-10-CM

## 2013-04-28 DIAGNOSIS — J449 Chronic obstructive pulmonary disease, unspecified: Secondary | ICD-10-CM

## 2013-04-28 DIAGNOSIS — M5137 Other intervertebral disc degeneration, lumbosacral region: Secondary | ICD-10-CM

## 2013-04-28 DIAGNOSIS — M545 Low back pain, unspecified: Secondary | ICD-10-CM

## 2013-04-28 DIAGNOSIS — F17200 Nicotine dependence, unspecified, uncomplicated: Secondary | ICD-10-CM

## 2013-04-28 DIAGNOSIS — M5030 Other cervical disc degeneration, unspecified cervical region: Secondary | ICD-10-CM

## 2013-04-28 DIAGNOSIS — Z23 Encounter for immunization: Secondary | ICD-10-CM

## 2013-04-28 DIAGNOSIS — M542 Cervicalgia: Secondary | ICD-10-CM

## 2013-04-28 DIAGNOSIS — Z720 Tobacco use: Secondary | ICD-10-CM

## 2013-04-28 MED ORDER — ALBUTEROL SULFATE (2.5 MG/3ML) 0.083% IN NEBU
2.5000 mg | INHALATION_SOLUTION | Freq: Once | RESPIRATORY_TRACT | Status: AC
  Administered 2013-04-28: 2.5 mg via RESPIRATORY_TRACT

## 2013-04-28 NOTE — Progress Notes (Signed)
Subjective:    Patient ID: Lorraine Kline, female    DOB: [birth year], 57 y.o.   MRN: [id number]  HPI COPD-here for spirometry today. She already has evidence of emphysema on her chest x-ray. Her last breathing test was about 3 or 4 years ago. We have not done one since she's been a patient here. She does have known COPD and is a smoker. She said she did start to feel a little bit of short of breath and cough this morning in the car on the way here about 30 minutes prior to her visit. Otherwise she had not had any recent flares with her COPD.  Depression-we were weaning her medication and starting her on fluoxetine. She's only been on the new medication about a week. She has done okay so far with the weaning. She says she has been a little bit more quiet than usual her coworkers have noticed. She does not feel like her mood has worsened.  Tobacco abuse-She rolls her cigarettes. She has really cut back. She puts a filter in it.    Back pain -Says backhurts a lot at work, uppre back pain.  She has been using Aleve and IBU and thinks that is why BP is up.  Has been getting more frequent HA. She says the pain is directly over her spine. She also has some discomfort in her low back as well as her neck. She denies any radiation of pain. It seems to be worse at work with a lot of activity. Gets better with rest. She tries to stretch out when it becomes more painful. She says it or tears at times and she feels that she actually has a pre-intolerance. Pain has been ongoing for several years. No trauma. She had a bad experience with physical therapy for her left shoulder so she does not want to do that again for her back. Review of Systems     Objective:   Physical Exam  Constitutional: She is oriented to person, place, and time. She appears well-developed and well-nourished.  HENT:  Head: Normocephalic and atraumatic.  Eyes: Conjunctivae and EOM are normal. Pupils are equal, round, and reactive to light.   Cardiovascular: Normal rate, regular rhythm and normal heart sounds.   Pulmonary/Chest: Effort normal and breath sounds normal. She has no wheezes.  Musculoskeletal:  Lumbar spine with normal flexion and extension and rotation. She's mildly tender over the upper thoracic and lower thoracic spine. She's also mildly tender over the lower lumbar spine. Neck with normal flexion extension. Slightly decreased rotation right and left.  Neurological: She is alert and oriented to person, place, and time.  Skin: Skin is warm and dry.  Psychiatric: She has a normal mood and affect.          Assessment & Plan:  COPD-spirometry showed FVC of 85%, FEV1 of 74% and ratio of 71%. Some mild improvement with albuterol but not significant for asthma. Readings consistent with mild to moderate obstruction..  Reviewed results with her today. She is in the mild to moderate  zone. Recommend yearly flu vaccine and rescue inhaler as needed. Did encourage her to check to date on her inhaler home to make sure that it's still in date.  Depression/anxiety-PHQ 9 score of 17, severe and gad 7 score of 14 which is moderate. She's not been on a new medication for a little over week. I like to see her back in a month to make sure that she's tolerating it well and to adjust  her dose as needed. Hopefully it will be more helpful.  Upper and lower back pain - will start with x-rays for further evaluation as her pain has been ongoing for several years at this point in time. Can use anti-inflammatories as needed. Recommend stretching and heat. Can consider referring to partner Dr. Rodney Langton for further evaluation as she is not interested in physical therapy.  Tobacco abuse-encouraged her to continue to work on decreasing her intake and eventually work on cessation. Gave her encouragement.  Hypertension-uncontrolled. Likely from NSAIDs. We will continue to keep an eye on this and recheck her followup appointment. If it's  still elevated then we will need to adjust her medication I see her back.

## 2013-05-01 ENCOUNTER — Other Ambulatory Visit: Payer: Self-pay | Admitting: Family Medicine

## 2013-05-01 ENCOUNTER — Encounter: Payer: Self-pay | Admitting: Family Medicine

## 2013-05-01 MED ORDER — PREDNISONE 20 MG PO TABS: 40.0000 mg | ORAL_TABLET | Freq: Every day | ORAL | Status: DC

## 2013-05-05 ENCOUNTER — Encounter: Payer: Self-pay | Admitting: Family Medicine

## 2013-05-22 ENCOUNTER — Other Ambulatory Visit: Payer: Self-pay | Admitting: Family Medicine

## 2013-05-30 ENCOUNTER — Encounter: Payer: Self-pay | Admitting: Family Medicine

## 2013-05-30 ENCOUNTER — Ambulatory Visit (INDEPENDENT_AMBULATORY_CARE_PROVIDER_SITE_OTHER): Payer: BC Managed Care – PPO | Admitting: Family Medicine

## 2013-05-30 VITALS — BP 163/92 | HR 86 | Wt 142.0 lb

## 2013-05-30 DIAGNOSIS — F418 Other specified anxiety disorders: Secondary | ICD-10-CM | POA: Insufficient documentation

## 2013-05-30 DIAGNOSIS — G8929 Other chronic pain: Secondary | ICD-10-CM

## 2013-05-30 DIAGNOSIS — M549 Dorsalgia, unspecified: Secondary | ICD-10-CM

## 2013-05-30 DIAGNOSIS — I10 Essential (primary) hypertension: Secondary | ICD-10-CM

## 2013-05-30 DIAGNOSIS — F341 Dysthymic disorder: Secondary | ICD-10-CM

## 2013-05-30 MED ORDER — FLUOXETINE HCL 40 MG PO CAPS: 40.0000 mg | ORAL_CAPSULE | Freq: Every day | ORAL | Status: DC

## 2013-05-30 MED ORDER — LISINOPRIL 20 MG PO TABS: ORAL_TABLET | ORAL | Status: DC

## 2013-05-30 MED ORDER — CYCLOBENZAPRINE HCL 10 MG PO TABS: 10.0000 mg | ORAL_TABLET | Freq: Every evening | ORAL | Status: DC | PRN

## 2013-05-30 MED ORDER — HYDROCHLOROTHIAZIDE 25 MG PO TABS: 25.0000 mg | ORAL_TABLET | Freq: Every day | ORAL | Status: DC

## 2013-05-30 MED ORDER — MELOXICAM 15 MG PO TABS: 15.0000 mg | ORAL_TABLET | Freq: Every day | ORAL | Status: DC | PRN

## 2013-05-30 NOTE — Progress Notes (Signed)
Subjective:    Patient ID: Lorraine Kline, female    DOB: 09/30/1955, 57 y.o.   MRN: [id number]  HPI F/u Mood - We switched her to fluoxetine about 5 weeks ago. Says her back pain has been severe latley and feels like it is affecting her mood. She has been out of meds about a week ago.    She still having significant back pain. She says mostly it's the midthoracic area near the bra strap in her neck. Though occasionally her low back hurts as well. She does work in a [employer]. She says at times she does have to lean over and then over trap stretch out her back because become so painful. She says she's getting to the point were ibuprofen and naproxen to work at all. She's been taking a lot of NSAIDs I last saw her and asked her to stop it because her blood pressure was high and uncontrolled.  Hypertension-no chest pain or shortness of breath or palpitations. She says she's taking her amlodipine without any side effects or problems.  Review of Systems  BP 163/92  Pulse 86  Wt 142 lb (64.411 kg)  BMI 25.97 kg/m2    Allergies  Allergen Reactions  . Penicillins   . Sertraline Other (See Comments)    Worsened her depression  . Tamoxifen     Past Medical History  Diagnosis Date  . HTN (hypertension)   . High cholesterol   . Breast cancer     Past Surgical History  Procedure Laterality Date  . Carpal tunnel release  1991    Right hand  . Rotator cuff repair  2007    Left shoulder  . Ganglion cyst excision  1996    Left hand  . Breast lumpectomy  2007    Right breast  . Breast reduction surgery  2007    Left breast    History   Social History  . Marital Status: Married    Spouse Name: Shary Key    Number of Children: 3  . Years of Education: Assoc degr   Occupational History  . Not on file.   Social History Main Topics  . Smoking status: Current Every Day Smoker -- 1.00 packs/day for 30 years    Types: Cigarettes  . Smokeless tobacco: Never Used  . Alcohol  Use: No  . Drug Use: No  . Sexual Activity: Yes    Partners: Male   Other Topics Concern  . Not on file   Social History Narrative   No regular exercise, she says secondary to joint pain. 2 caffeine drinks per day.    Family History  Problem Relation Age of Onset  . Heart disease Mother 41  . Diabetes Mother   . Hyperlipidemia Mother   . Heart failure Mother   . Lung cancer Father   . Heart disease Father   . Diabetes Father   . Hypertension Father   . Hyperlipidemia Father   . Cancer Father     Lung  . Breast cancer      An  . Cancer Other     Lung    Outpatient Encounter Prescriptions as of 05/30/2013  Medication Sig Dispense Refill  . acetaminophen (TYLENOL) 500 MG tablet Take 500 mg by mouth every 6 (six) hours as needed.      Marland Kitchen amLODipine (NORVASC) 10 MG tablet Take 1 tablet (10 mg total) by mouth daily.  90 tablet  1  . buPROPion (WELLBUTRIN XL) 150 MG 24  hr tablet Take 1 tablet (150 mg total) by mouth daily.  90 tablet  1  . FLUoxetine (PROZAC) 40 MG capsule Take 1 capsule (40 mg total) by mouth daily.  30 capsule  1  . lisinopril (PRINIVIL,ZESTRIL) 20 MG tablet TAKE ONE TABLET BY MOUTH TWICE DAILY  1 tablet  0  . [DISCONTINUED] FLUoxetine (PROZAC) 10 MG capsule TAKE ONE CAPSULE BY MOUTH DAILY FOR 7 DAYS, THEN INCREASE TO TWO CAPSULES BY MOUTH DAILY  60 capsule  1  . [DISCONTINUED] lisinopril (PRINIVIL,ZESTRIL) 20 MG tablet TAKE ONE TABLET BY MOUTH TWICE DAILY  60 tablet  2  . [DISCONTINUED] predniSONE (DELTASONE) 20 MG tablet Take 2 tablets (40 mg total) by mouth daily.  10 tablet  0  . albuterol (PROVENTIL HFA;VENTOLIN HFA) 108 (90 BASE) MCG/ACT inhaler Inhale 2 puffs into the lungs every 4 (four) hours as needed for wheezing.  1 Inhaler  0  . cyclobenzaprine (FLEXERIL) 10 MG tablet Take 1 tablet (10 mg total) by mouth at bedtime as needed for muscle spasms.  30 tablet  0  . hydrochlorothiazide (HYDRODIURIL) 25 MG tablet Take 1 tablet (25 mg total) by mouth daily.   30 tablet  1  . meloxicam (MOBIC) 15 MG tablet Take 1 tablet (15 mg total) by mouth daily as needed for pain.  30 tablet  1   No facility-administered encounter medications on file as of 05/30/2013.          Objective:   Physical Exam  Constitutional: She is oriented to person, place, and time. She appears well-developed and well-nourished.  HENT:  Head: Normocephalic and atraumatic.  Eyes: Conjunctivae and EOM are normal.  Cardiovascular: Normal rate.   Pulmonary/Chest: Effort normal.  Neurological: She is alert and oriented to person, place, and time.  Skin: Skin is dry. No pallor.  Psychiatric: She has a normal mood and affect. Her behavior is normal.          Assessment & Plan:  Depression/anxiety - . Her PHQ 9 score went from 17-7. Her gad 7 score went up. It went from 14- to 45. We discussed different options. I would like to try increasing her fluoxetine to 40 mg since she's been tolerating it well so far. Prescription sent to pharmacy. I would like to see her back in about 6 weeks to make sure that she's tolerating it well. Continue Wellbutrin as well.  Chronic Back Pain - will try flexeril at bedtime. Warned that it can be sedating and to just use it when she is home. It for the daytime while she's at work recommend trying a different anti-inflammatory. 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 Will try maybe. She did do x-rays back in August which showed some degenerative disc disease as well. I will like her to have her see my partner Dr. Rodney Langton for further evaluation and recommendations for treatment. She says she's not interested in any type of surgical option.  HTN- uncontrolled. Will add hydrochlorothiazide to her amlodipine. She says she has about 2 month's worth of the amlodipine and lisinopril which she takes twice a day. We can certainly come on lisinopril and chlorothiazide when  she's due for refill. Followup in 6 weeks to recheck blood pressure at that time.

## 2013-07-10 ENCOUNTER — Institutional Professional Consult (permissible substitution): Payer: BC Managed Care – PPO | Admitting: Sports Medicine

## 2013-07-18 ENCOUNTER — Encounter: Payer: Self-pay | Admitting: Family Medicine

## 2013-07-18 ENCOUNTER — Ambulatory Visit (INDEPENDENT_AMBULATORY_CARE_PROVIDER_SITE_OTHER): Payer: BC Managed Care – PPO | Admitting: Family Medicine

## 2013-07-18 ENCOUNTER — Ambulatory Visit (INDEPENDENT_AMBULATORY_CARE_PROVIDER_SITE_OTHER): Payer: BC Managed Care – PPO | Admitting: Sports Medicine

## 2013-07-18 ENCOUNTER — Encounter: Payer: Self-pay | Admitting: Sports Medicine

## 2013-07-18 ENCOUNTER — Ambulatory Visit (INDEPENDENT_AMBULATORY_CARE_PROVIDER_SITE_OTHER): Payer: BC Managed Care – PPO

## 2013-07-18 VITALS — BP 139/93 | HR 88 | Wt 144.0 lb

## 2013-07-18 DIAGNOSIS — M5030 Other cervical disc degeneration, unspecified cervical region: Secondary | ICD-10-CM

## 2013-07-18 DIAGNOSIS — M51369 Other intervertebral disc degeneration, lumbar region without mention of lumbar back pain or lower extremity pain: Secondary | ICD-10-CM | POA: Insufficient documentation

## 2013-07-18 DIAGNOSIS — M5136 Other intervertebral disc degeneration, lumbar region: Secondary | ICD-10-CM

## 2013-07-18 DIAGNOSIS — F341 Dysthymic disorder: Secondary | ICD-10-CM

## 2013-07-18 DIAGNOSIS — I10 Essential (primary) hypertension: Secondary | ICD-10-CM

## 2013-07-18 DIAGNOSIS — Z1231 Encounter for screening mammogram for malignant neoplasm of breast: Secondary | ICD-10-CM

## 2013-07-18 DIAGNOSIS — F418 Other specified anxiety disorders: Secondary | ICD-10-CM

## 2013-07-18 DIAGNOSIS — M5137 Other intervertebral disc degeneration, lumbosacral region: Secondary | ICD-10-CM

## 2013-07-18 DIAGNOSIS — M51379 Other intervertebral disc degeneration, lumbosacral region without mention of lumbar back pain or lower extremity pain: Secondary | ICD-10-CM

## 2013-07-18 DIAGNOSIS — Z23 Encounter for immunization: Secondary | ICD-10-CM

## 2013-07-18 MED ORDER — GABAPENTIN 300 MG PO CAPS: ORAL_CAPSULE | ORAL | Status: DC

## 2013-07-18 MED ORDER — PREDNISONE 50 MG PO TABS: ORAL_TABLET | ORAL | Status: DC

## 2013-07-18 MED ORDER — FLUOXETINE HCL 20 MG PO CAPS: 60.0000 mg | ORAL_CAPSULE | Freq: Every day | ORAL | Status: DC

## 2013-07-18 NOTE — Progress Notes (Addendum)
  Subjective:    Patient ID: Lorraine Kline, female    DOB: [birth year], 57 y.o.   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 She absolutely hates her job and this is her biggest stressor. She hates her boss. She also complains of trouble concentrating nearly every day. She still feels nervous and on edge on a daily and has been somewhat irritable. The she said she's actually been able to control her anger with her boss a little bit more since being on the fluoxetine 40 mg per she's tolerating it well without any side effects or problems. Her sister is coming to visit at the end of November and she is very excited about this, she was in [place].  HTN- we had hives with hydrochlorothiazide to her regimen last time I saw her about a month ago. She admits she's having difficulty being consistent with hydrochlorothiazide because she has to take it at a different time than the lisinopril and the amlodipine.  Review of Systems     Objective:   Physical Exam  Constitutional: She is oriented to person, place, and time. She appears well-developed and well-nourished.  HENT:  Head: Normocephalic and atraumatic.  Cardiovascular: Normal rate, regular rhythm and normal heart sounds.   Pulmonary/Chest: Effort normal and breath sounds normal.  Neurological: She is alert and oriented to person, place, and time.  Skin: Skin is warm and dry.  Psychiatric: She has a normal mood and affect. Her behavior is normal.          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 PHQ 9 score of 11. Uncontrolled. Discussed different options including increasing the fluoxetine to help with the anxiety symptoms versus just changing the medication around. She failed Wellbutrin previously. We'll increase fluoxetine to 60 mg and followup in one month if it's still not helping Weston Brass maximally control  her symptoms. If she's otherwise doing well, then I will see her back in 2 months.  HTN - improved, but still not completely controlled. We discussed the importance of trying to work on consistency with her medications. Followup in 2 months.  Given flu vaccine and Prevnar 13 vaccine.

## 2013-07-18 NOTE — Assessment & Plan Note (Addendum)
Home PT, she works everyday and is unable to do formal PT, prednisone, low-dose gabapentin in an up taper. Her mid back pain on the right side is most likely due to his cervical radiculitis. Return to see me in 4 weeks, MRI for interventional injection planning if no better.

## 2013-07-18 NOTE — Progress Notes (Signed)
   Subjective:    I'm seeing this patient as a consultation for:  Dr. Linford Arnold  CC: Neck/back pain  HPI: This is a 57 year old female who presents with back and neck pain. The neck pain is persistent, worse with forward flexion. It does not radiate to either arm, although there is pain in the middle of her back, particularly on the right side. 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 She also has low back pain that is worsened by prolonged sitting. She occasionally experiences numbness and tingling in the right lateral thigh and anterior lower leg. She has been managing the pain with acetaminophen, flexeril and meloxicam. She is interested in trying physical therapy but works 10-hour shifts 6 days a week and cannot take off time for those appointments.  Past medical history, Surgical history, Family history not pertinant except as noted below, Social history, Allergies, and medications have been entered into the medical record, reviewed, and no changes needed.   Review of Systems: No headache, visual changes, nausea, vomiting, diarrhea, constipation, dizziness, abdominal pain, skin rash, fevers, chills, night sweats, weight loss, swollen lymph nodes, body aches, joint swelling, muscle aches, chest pain, shortness of breath, mood changes, visual or auditory hallucinations.   Objective:   General: Well Developed, well nourished, and in no acute distress.  Neuro/Psych: Alert and oriented x3, extra-ocular muscles intact, able to move all 4 extremities, sensation grossly intact. Skin: Warm and dry, no rashes noted.  Respiratory: Not using accessory muscles, speaking in full sentences, trachea midline.  Cardiovascular: Pulses palpable, no extremity edema. Abdomen: Does not appear distended. Back Exam:  Inspection: Unremarkable  Motion: Flexion 45 deg, Extension 45 deg, Side Bending to 45 deg bilaterally,  Rotation to 45 deg bilaterally  SLR laying: Positive bilaterally XSLR laying:  Negative  Palpable tenderness: None. FABER: Positive bilaterally Sensory change: Gross sensation intact to all lumbar and sacral dermatomes.  Reflexes: 2+ at both patellar tendons, 2+ at achilles tendons, Babinski's downgoing.  Strength at foot  Plantar-flexion: 5/5 Dorsi-flexion: 5/5 Eversion: 5/5 Inversion: 5/5  Leg strength  Quad: 5/5 Hamstring: 5/5 Hip flexor: 5/5 Hip abductors: 5/5  Gait unremarkable.  Cervical Spine X-rays: Degenerative disc disease at C3-4 and C4-C5. Loss of cervical lordosis. This can be related to patient positioning, muscle spasm or soft tissue injury.  Thoracic Spine X-rays: No acute bony abnormality in the thoracic spine  Lumbar Spine X-rays: Two-view study shows no fracture. Loss of disc height is evident at L1-L2 and L2-L3. SI joints are normal in appearance.  No worrisome lytic or sclerotic osseous abnormality.    Impression and Recommendations:   This case required medical decision making of moderate complexity.  Assessment: This is a 57 year old female whose symptoms are consistent with the degenerative disc changes in the cervical and lumbar spine.  Plan: 1. 5-day course of prednisone 2. Continue Meloxicam 3. Complete home physical therapy exercises daily 4. Return for follow up in 1 month  This note was originally written by Karin Lieu MS3.

## 2013-07-18 NOTE — Assessment & Plan Note (Addendum)
Home PT, she works everyday and is unable to do formal PT, prednisone, low-dose gabapentin in an up taper. She does have some right L5 radicular symptoms. Return to see me in 4 weeks, MRI for interventional injection planning if no better.

## 2013-08-22 ENCOUNTER — Ambulatory Visit: Payer: BC Managed Care – PPO | Admitting: Sports Medicine

## 2013-09-04 IMAGING — CR DG CHEST 2V
2 series · 2 of 2 positions shown · non-contrast
Comparison: None.

CLINICAL DATA: Cough.  Cigarette smoker.

CHEST - 2 VIEW

[view not recorded (1 of 2)]
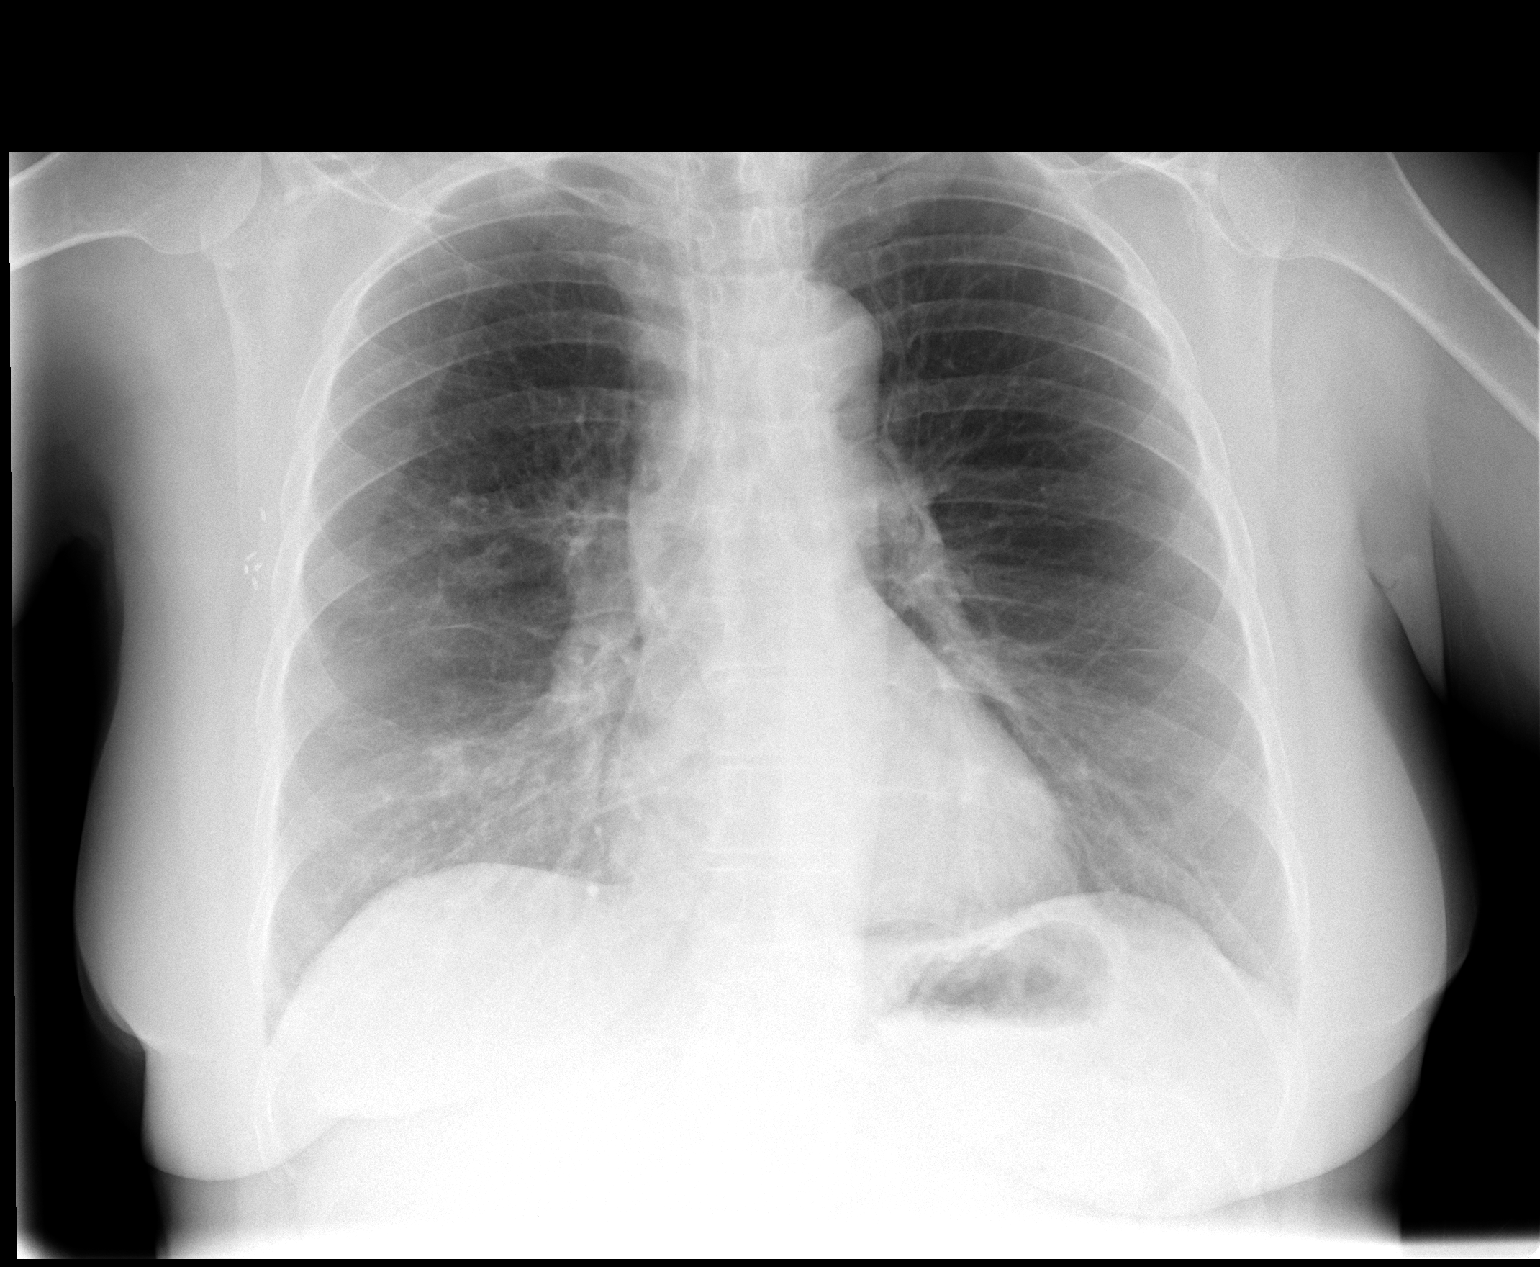

[view not recorded (2 of 2)]
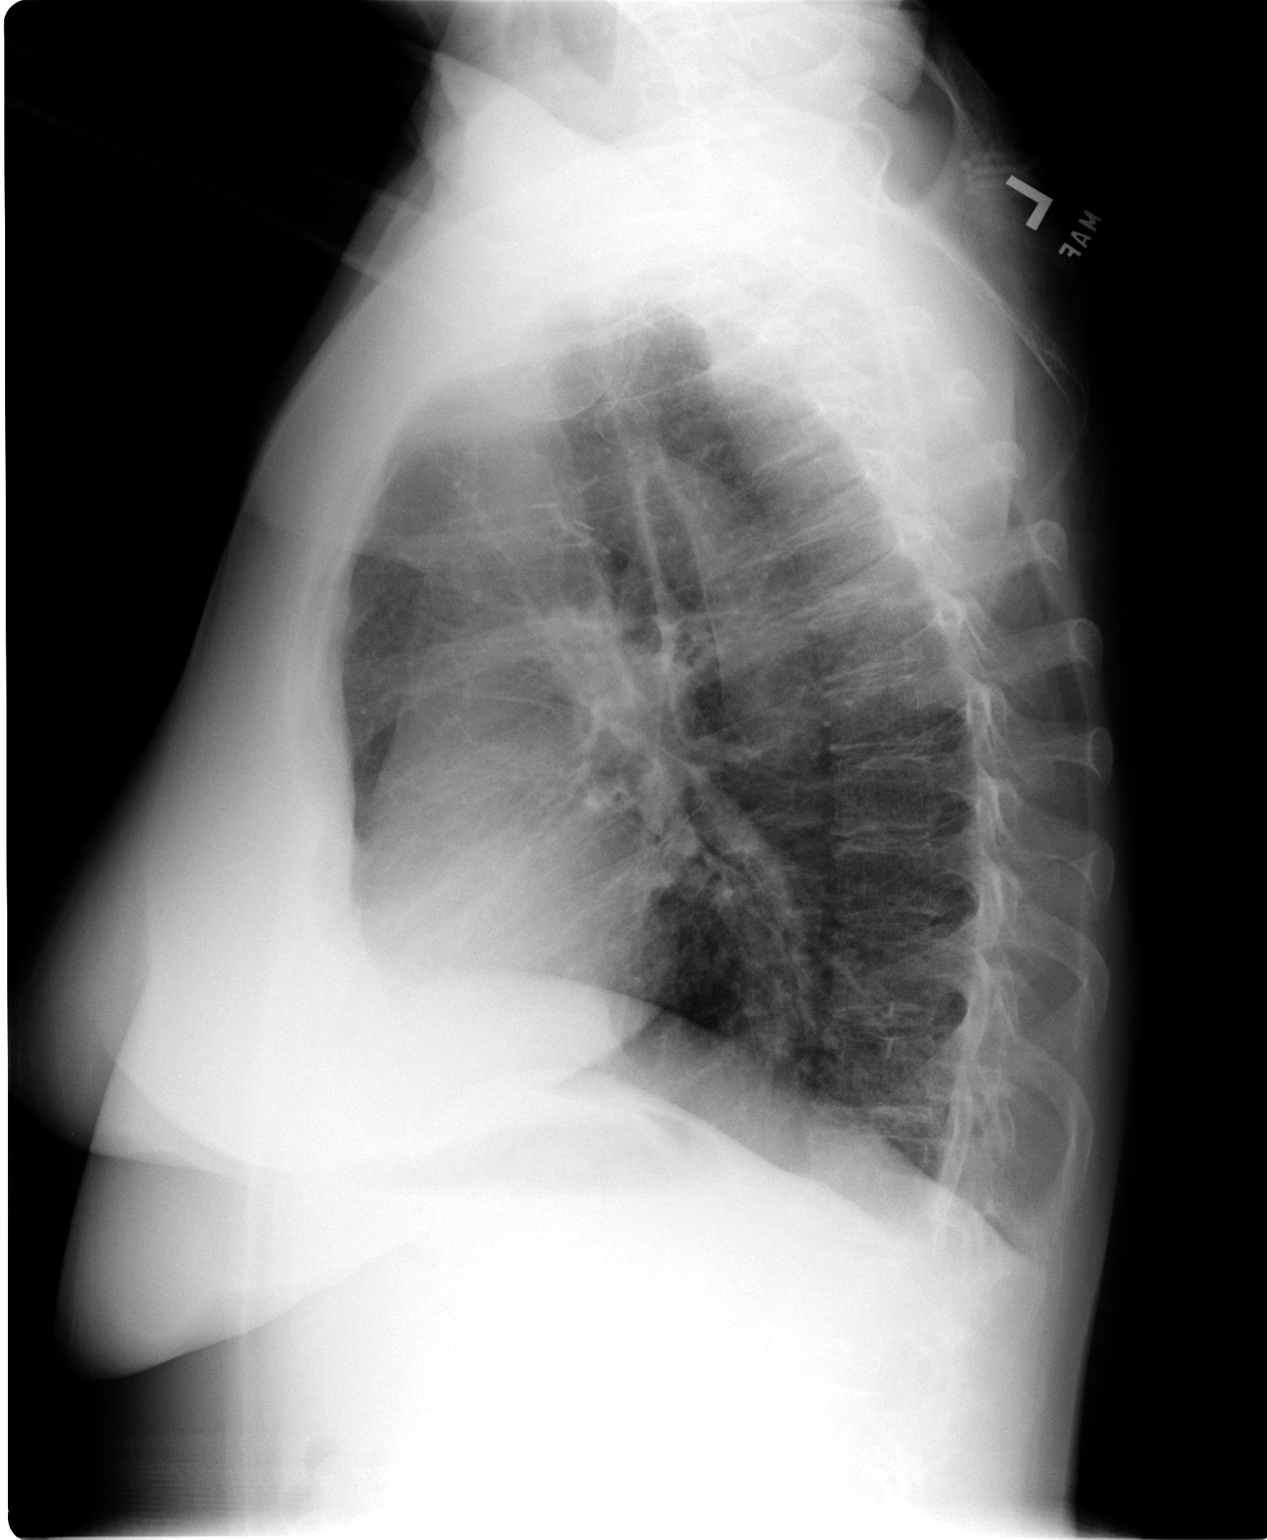

[2 of 2 positions shown; findings below may reference images not displayed]

FINDINGS: Patchy density is present at the right lung base,
localizing to the low right lower lobe on the lateral view,
anterior to the thoracic spine.  Surgical clips are present in the
right axilla, compatible with axillary dissection.
Cardiopericardial silhouette appears within normal limits.
Mediastinal contours are normal.  No effusion. Follow-up to ensure
radiographic clearing recommended.  Clearing is usually observed at
8 weeks.
IMPRESSION: Right lower lobe pneumonia.

## 2013-10-25 ENCOUNTER — Other Ambulatory Visit: Payer: Self-pay | Admitting: Family Medicine

## 2014-01-04 ENCOUNTER — Encounter: Payer: Self-pay | Admitting: Family Medicine

## 2014-01-04 ENCOUNTER — Ambulatory Visit (INDEPENDENT_AMBULATORY_CARE_PROVIDER_SITE_OTHER): Payer: BC Managed Care – PPO | Admitting: Family Medicine

## 2014-01-04 VITALS — BP 160/95 | HR 100 | Wt 140.0 lb

## 2014-01-04 DIAGNOSIS — I10 Essential (primary) hypertension: Secondary | ICD-10-CM

## 2014-01-04 DIAGNOSIS — M5416 Radiculopathy, lumbar region: Secondary | ICD-10-CM

## 2014-01-04 DIAGNOSIS — IMO0002 Reserved for concepts with insufficient information to code with codable children: Secondary | ICD-10-CM

## 2014-01-04 DIAGNOSIS — M5030 Other cervical disc degeneration, unspecified cervical region: Secondary | ICD-10-CM

## 2014-01-04 MED ORDER — PREDNISONE 50 MG PO TABS: ORAL_TABLET | ORAL | Status: DC

## 2014-01-04 MED ORDER — KETOROLAC TROMETHAMINE 60 MG/2ML IM SOLN
60.0000 mg | Freq: Once | INTRAMUSCULAR | Status: AC
  Administered 2014-01-04: 60 mg via INTRAMUSCULAR

## 2014-01-04 MED ORDER — CYCLOBENZAPRINE HCL 10 MG PO TABS: 10.0000 mg | ORAL_TABLET | Freq: Every evening | ORAL | Status: AC | PRN

## 2014-01-04 MED ORDER — LISINOPRIL 20 MG PO TABS: ORAL_TABLET | ORAL | Status: DC

## 2014-01-04 MED ORDER — HYDROCHLOROTHIAZIDE 25 MG PO TABS: 25.0000 mg | ORAL_TABLET | Freq: Every day | ORAL | Status: DC

## 2014-01-04 MED ORDER — ALBUTEROL SULFATE HFA 108 (90 BASE) MCG/ACT IN AERS: 2.0000 | INHALATION_SPRAY | RESPIRATORY_TRACT | Status: AC | PRN

## 2014-01-04 MED ORDER — FLUOXETINE HCL 20 MG PO CAPS: 60.0000 mg | ORAL_CAPSULE | Freq: Every day | ORAL | Status: DC

## 2014-01-04 MED ORDER — AMLODIPINE BESYLATE 10 MG PO TABS: 10.0000 mg | ORAL_TABLET | Freq: Every day | ORAL | Status: DC

## 2014-01-04 MED ORDER — GABAPENTIN 300 MG PO CAPS: ORAL_CAPSULE | ORAL | Status: AC

## 2014-01-04 NOTE — Patient Instructions (Signed)
Take the prednisone once a day for 5 days. Make sure take with food and water and stop immediately if any stomach upset or irritation. Do not take any ibuprofen or Aleve products with this medication. When she completed the prednisone then you can switch to ibuprofen 800 mg 3 times a day or Aleve, one tab twice a day. Again take these with food and water. The muscle relaxer can be taken in the evening or at bedtime as long as you're not going to drive. As it can be sedating. If you're not feeling better in the next 2-3 weeks then please let me know. Also consider physical therapy if you're not improving. Make sure you're exercises on her own at home in the meantime.

## 2014-01-04 NOTE — Progress Notes (Signed)
   Subjective:    Patient ID: Lorraine Kline, female    DOB: 03-Jul-1956, 58 y.o.   MRN: [id number]  HPI  Back Pain - pt thinks that she has a pinched nerve x 1 month pain goes down back and side of R leg she has been using aleve, tylenol, and IBU for pain not helpingPain is sharp.  No numbness or tingling.  bilat back pain.  Hurts with all activities. She has to pick up heavy boxes at time. No trauma or injury. Better with rest.  Extremely painful to go up and down stairs. She does a lot of heavy lifting of boxes etc. for her job. She works in a [employer].  HTN-no chest pain or shortness of breath. She has been running out of her medications as she has not followed up recently. She does need refills sent today. Review of Systems     Objective:   Physical Exam  Musculoskeletal:  Lumbar spine with decreased flexion to about 90. Slightly decreased extension as well. Normal rotation right and left and side bending right and left. She's very tender over the lower lumbar spine and right paraspinous muscles of the lumbar spine. Nontender over the SI joint. Negative straight leg is bilaterally. Hip strength is 4-5 on the right compared to 5 out of 5 on the left. I think is secondary to discomfort. She is able to drive. Knee and ankle strength is 5 out of 5 bilaterally. Patellar reflexes 2+ on the right and 1+ on the left.          Assessment & Plan:  Acute on chronic low back pain with right radicular symptoms. No history of herniated disc. Discussed acute treatment Toradol injection. We'll also put her on prednisone 50 mg for 5 days. Also recommend she restart the gabapentin. She never filled it when it was written in October. The cost was quite a lot. We'll try this and it again and hopefully will be less expensive. It is generic so hopefully it will be reasonable. Also recommended a trial muscle relaxer. She says been years since she's tried days. Recommend gentle exercises. She or he has copies  home. Also will write her out of work for the next 4 days. She will need to bring in FMLA. She does a lot of heavy lifting at work and she needs to be able to rest her back for a few days before going back. Also discussed with her that long-term she may need to consider getting out of his line of work as it's very hard on the back and spine and she has known degenerative disc disease. If not improving then consider MRI or if noticing any weakness in that extremity. Given toradol IM here today for acute relief.    Hypertension-uncontrolled but she's out of her medications. Will refill them and she's to followup in 6 weeks for blood pressure check.

## 2014-01-10 ENCOUNTER — Telehealth: Payer: Self-pay | Admitting: *Deleted

## 2014-01-10 NOTE — Telephone Encounter (Signed)
Forms faxed .Teddy Spike

## 2014-01-12 ENCOUNTER — Telehealth: Payer: Self-pay | Admitting: *Deleted

## 2014-01-12 NOTE — Telephone Encounter (Signed)
Pt called and stated that she is still in a lot of pain. She has been doing the exercises that she was instructed to do and has taken the medication that was prescribed. She stated that she had to leave work last night and was unable to go to work today due to the pain and is asking for a note for today and also a pain medication that she can take while she is at work. She also wanted  Dr Madilyn Fireman to know that her job requires her to do heavy lifting and she has to do a lot of walking .Teddy Spike

## 2014-01-12 NOTE — Telephone Encounter (Signed)
Ok to write out of work for today to return to light duty( no lifting, pushing, pulling more than 10 lbs for 2 weeks)

## 2014-01-15 ENCOUNTER — Encounter: Payer: Self-pay | Admitting: *Deleted

## 2014-01-15 NOTE — Telephone Encounter (Signed)
lvm with Dr. Gardiner Ramus recommendations.Lorraine Kline

## 2014-01-19 ENCOUNTER — Other Ambulatory Visit: Payer: Self-pay | Admitting: Family Medicine

## 2014-01-25 ENCOUNTER — Encounter: Payer: Self-pay | Admitting: Family Medicine

## 2014-01-25 ENCOUNTER — Ambulatory Visit (INDEPENDENT_AMBULATORY_CARE_PROVIDER_SITE_OTHER): Payer: BC Managed Care – PPO | Admitting: Family Medicine

## 2014-01-25 VITALS — BP 145/87 | HR 103 | Wt 139.0 lb

## 2014-01-25 DIAGNOSIS — M5136 Other intervertebral disc degeneration, lumbar region: Secondary | ICD-10-CM

## 2014-01-25 DIAGNOSIS — M5030 Other cervical disc degeneration, unspecified cervical region: Secondary | ICD-10-CM

## 2014-01-25 DIAGNOSIS — I10 Essential (primary) hypertension: Secondary | ICD-10-CM

## 2014-01-25 DIAGNOSIS — M5137 Other intervertebral disc degeneration, lumbosacral region: Secondary | ICD-10-CM

## 2014-01-25 MED ORDER — LISINOPRIL 20 MG PO TABS
ORAL_TABLET | ORAL | Status: DC
Start: 2014-01-25 — End: 2014-06-01

## 2014-01-25 MED ORDER — HYDROCHLOROTHIAZIDE 25 MG PO TABS: 25.0000 mg | ORAL_TABLET | Freq: Every day | ORAL | Status: AC

## 2014-01-25 MED ORDER — AMLODIPINE BESYLATE 10 MG PO TABS: 10.0000 mg | ORAL_TABLET | Freq: Every day | ORAL | Status: AC

## 2014-01-25 MED ORDER — HYDROCODONE-ACETAMINOPHEN 5-325 MG PO TABS: 1.0000 | ORAL_TABLET | Freq: Four times a day (QID) | ORAL | Status: AC | PRN

## 2014-01-25 MED ORDER — LISINOPRIL 20 MG PO TABS
ORAL_TABLET | ORAL | Status: DC
Start: 2014-01-25 — End: 2014-01-25

## 2014-01-25 NOTE — Progress Notes (Signed)
   Subjective:    Patient ID: Lorraine Kline, female    DOB: 1956-08-20, 58 y.o.   MRN: [id number]  HPI HTN -  Pt denies chest pain, SOB, dizziness, or heart palpitations.  Taking meds as directed w/o problems.  Denies medication side effects.  She ran out of pills 2 days ago.  She is watching her salt.    Back pain - She feels like needs something stronger when she is in severe pain.  Overall her back is getting better slowly. She is back at work now.  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 Does a lot of heavy lifting and does a very physcial job.   Review of Systems     Objective:   Physical Exam  Constitutional: She is oriented to person, place, and time. She appears well-developed and well-nourished.  HENT:  Head: Normocephalic and atraumatic.  Cardiovascular: Normal rate, regular rhythm and normal heart sounds.   Pulmonary/Chest: Effort normal and breath sounds normal.  Neurological: She is alert and oriented to person, place, and time.  Skin: Skin is warm and dry.  Psychiatric: She has a normal mood and affect. Her behavior is normal.          Assessment & Plan:  HTN - well controlled usually. Out of med x 4 days. RF sent to the pharmacy. In fact it was actually filled on 5/1 but pt didn't go pick up.  Wants 90 days supply.  Back pain/lumbar DDD - will give her small supply of hydrocodone. Discussed risk and potential sedation. Don't take and drive etc. Use only for severe pain.   Encouraged to schedule a CPE.

## 2014-01-25 NOTE — Patient Instructions (Signed)
[employer].    Also recommend looking at the [place] for you blood pressure.

## 2014-05-25 ENCOUNTER — Other Ambulatory Visit: Payer: Self-pay | Admitting: Family Medicine

## 2014-06-01 ENCOUNTER — Other Ambulatory Visit: Payer: Self-pay

## 2014-06-01 MED ORDER — LISINOPRIL 20 MG PO TABS: ORAL_TABLET | ORAL | Status: DC

## 2014-06-01 MED ORDER — ALENDRONATE SODIUM 70 MG PO TABS: 70.0000 mg | ORAL_TABLET | ORAL | Status: AC

## 2014-06-01 MED ORDER — FLUOXETINE HCL 20 MG PO CAPS: 60.0000 mg | ORAL_CAPSULE | Freq: Every day | ORAL | Status: AC

## 2014-06-01 NOTE — Telephone Encounter (Signed)
Patient advised she will need to follow up before more refills.

## 2014-08-19 ENCOUNTER — Other Ambulatory Visit: Payer: Self-pay | Admitting: Family Medicine

## 2014-10-08 IMAGING — CR DG THORACIC SPINE 2V
3 series · 3 of 3 positions shown · non-contrast
Comparison: Chest x-ray from 04/07/2012

CLINICAL DATA: Midline back pain

THORACIC SPINE - 2 VIEW

[view not recorded (1 of 3)]
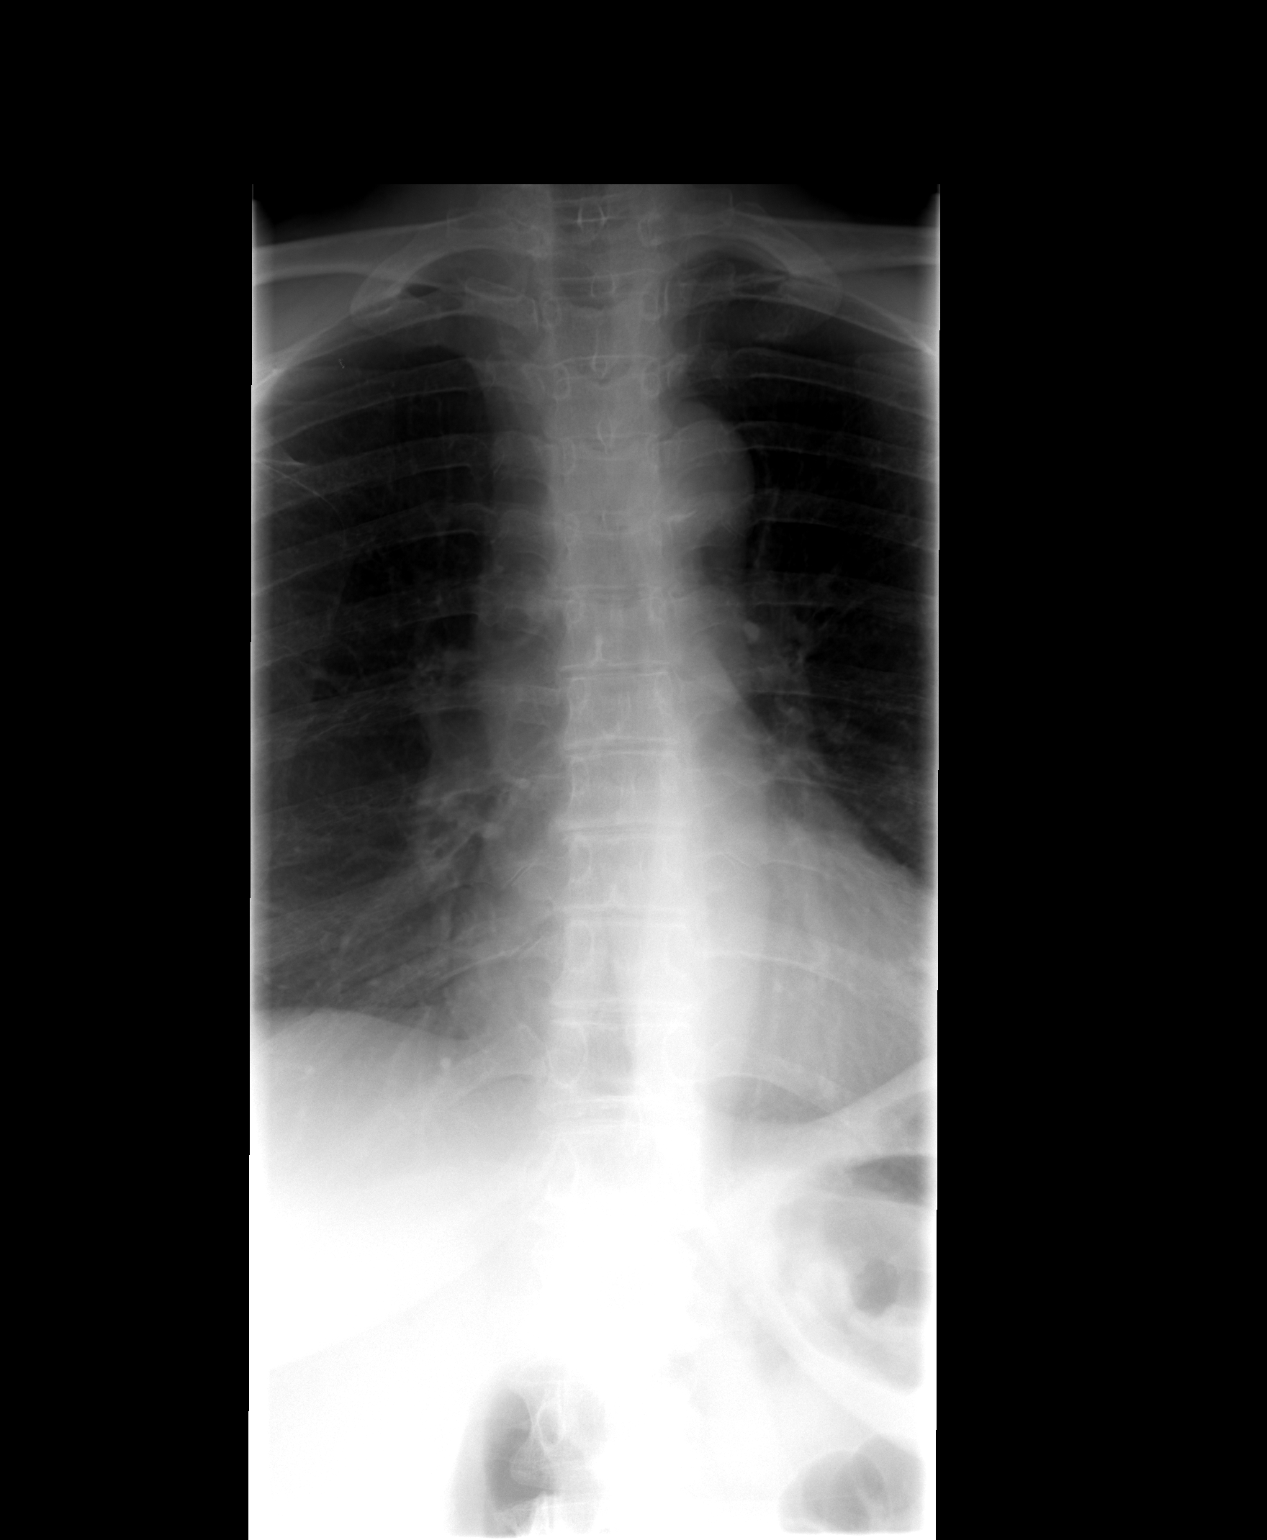

[view not recorded (2 of 3)]
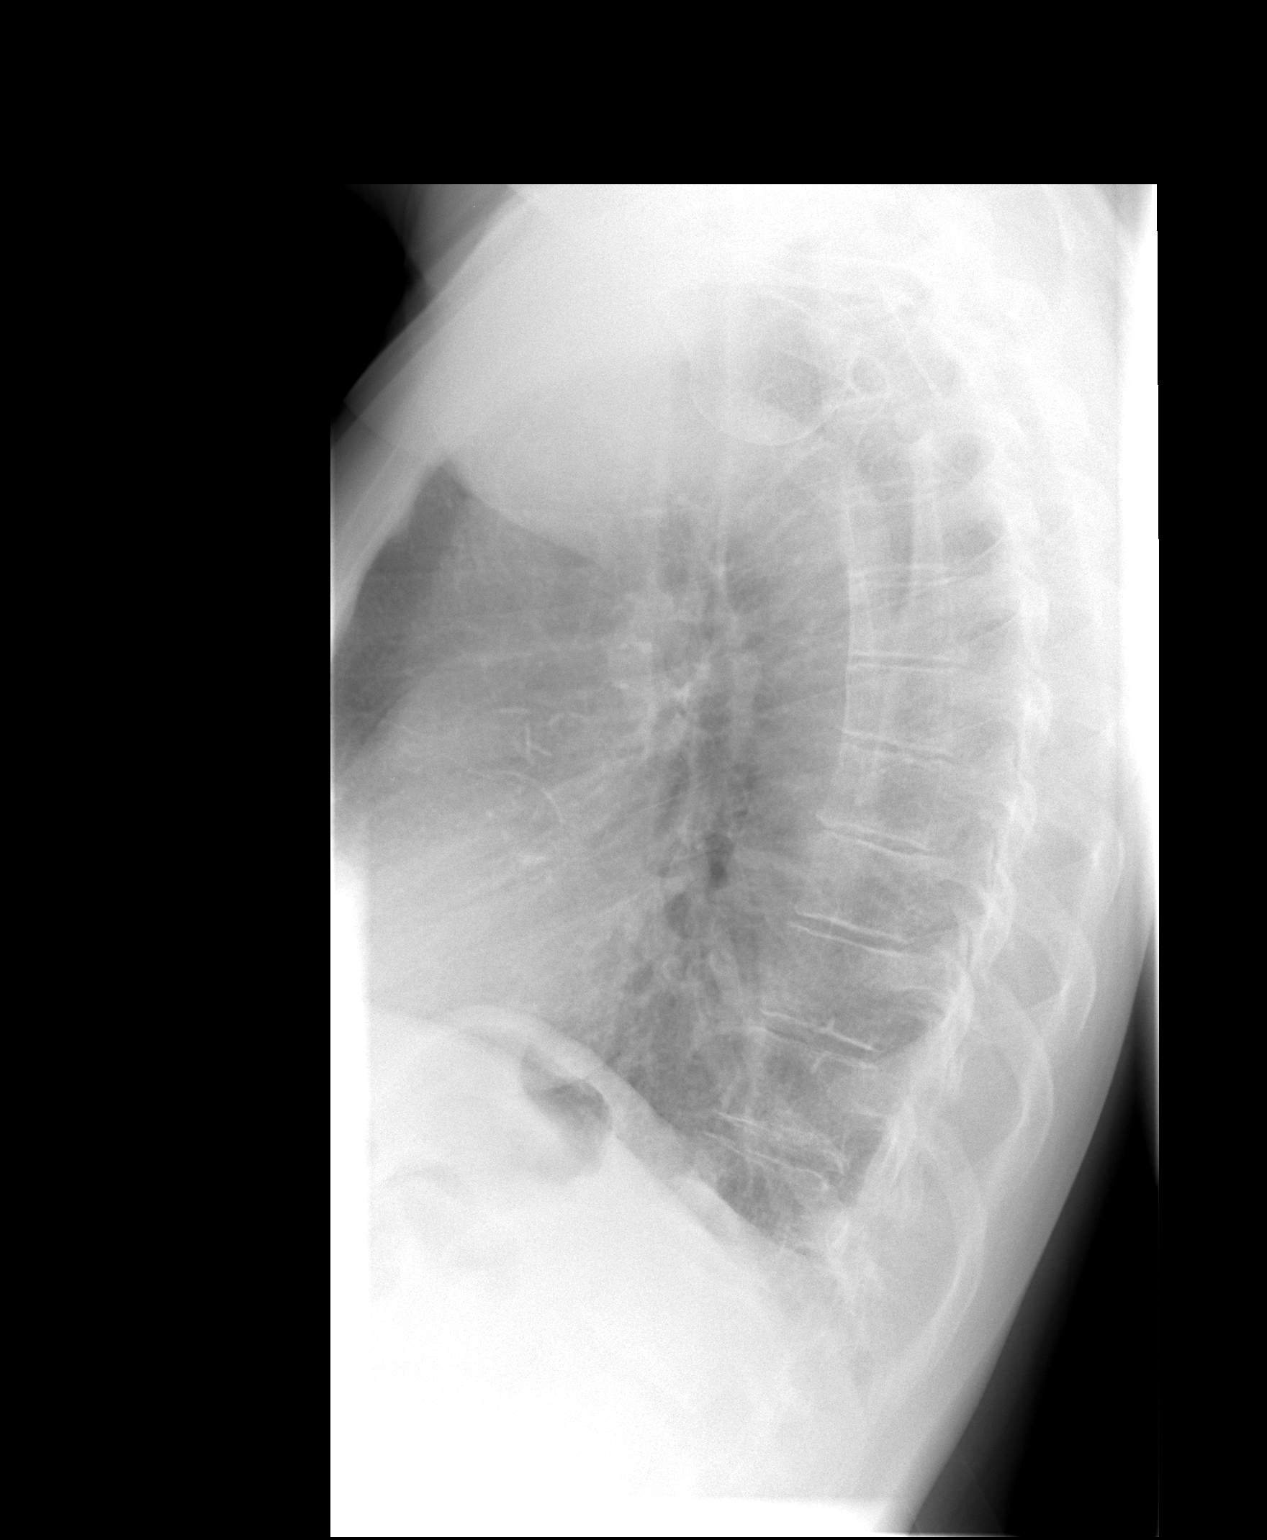

[view not recorded (3 of 3)]
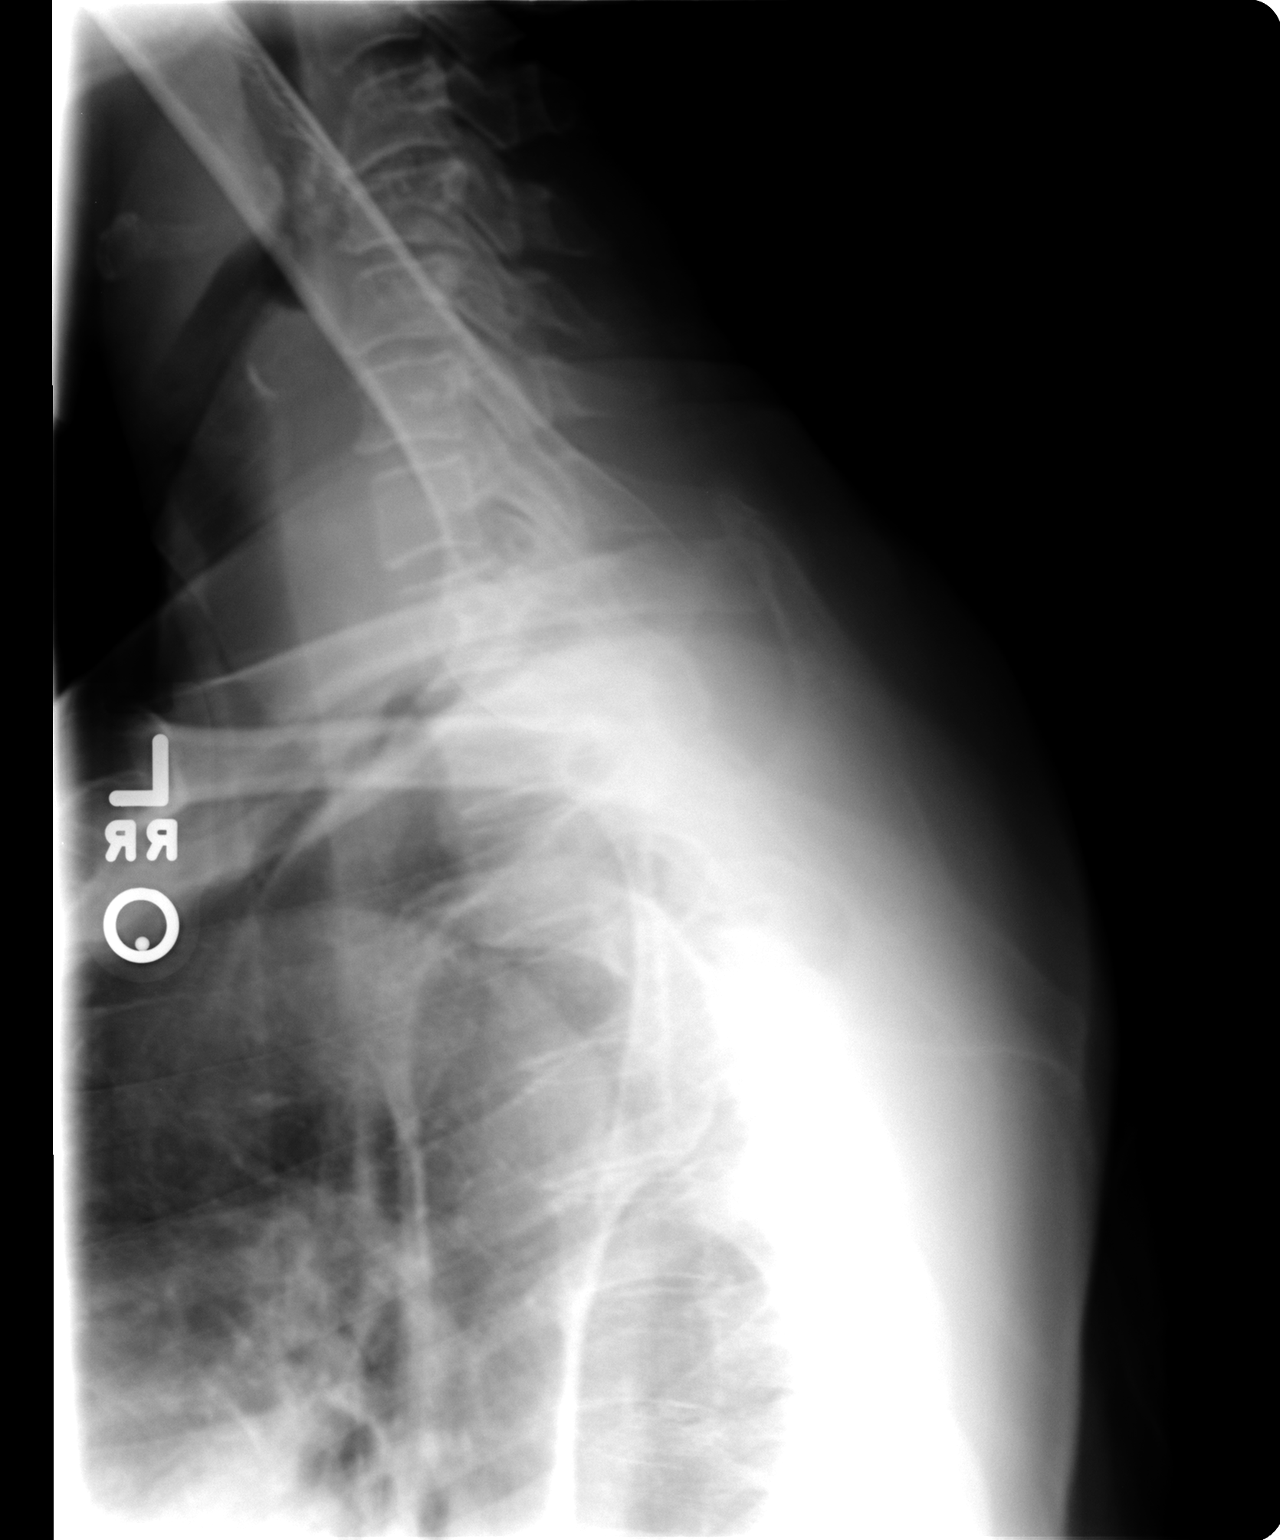

[3 of 3 positions shown; findings below may reference images not displayed]

FINDINGS: Swimmer's view is degraded by motion.  No evidence for
fracture.  No subluxation.  Intervertebral disc spaces are
preserved.  Frontal film shows no abnormal paraspinal line.  Stable
mild convex leftward lower thoracic scoliosis noted.
IMPRESSION: No acute bony abnormality in the thoracic spine.  Overall
appearance is stable since two-view chest x-ray of 04/07/2012.

## 2014-11-16 ENCOUNTER — Other Ambulatory Visit: Payer: Self-pay | Admitting: Family Medicine

## 2014-11-29 ENCOUNTER — Other Ambulatory Visit: Payer: Self-pay

## 2014-11-29 MED ORDER — LISINOPRIL 20 MG PO TABS: 20.0000 mg | ORAL_TABLET | Freq: Two times a day (BID) | ORAL | Status: AC

## 2014-11-29 NOTE — Telephone Encounter (Signed)
Patient advised she must schedule a follow up within the next month.

## 2017-11-19 DEATH — deceased
